# Patient Record
Sex: Female | Born: 1980 | Race: White | Hispanic: No | Marital: Married | Smoking: Former smoker
Health system: Southern US, Community
[De-identification: ages and names within clinical notes are randomized; demographics above are authoritative.]

## PROBLEM LIST (undated history)

## (undated) DIAGNOSIS — R51 Headache: Secondary | ICD-10-CM

## (undated) DIAGNOSIS — F429 Obsessive-compulsive disorder, unspecified: Secondary | ICD-10-CM

## (undated) DIAGNOSIS — K219 Gastro-esophageal reflux disease without esophagitis: Secondary | ICD-10-CM

## (undated) DIAGNOSIS — F419 Anxiety disorder, unspecified: Secondary | ICD-10-CM

## (undated) DIAGNOSIS — F32A Depression, unspecified: Secondary | ICD-10-CM

## (undated) DIAGNOSIS — R519 Headache, unspecified: Secondary | ICD-10-CM

## (undated) DIAGNOSIS — I10 Essential (primary) hypertension: Secondary | ICD-10-CM

## (undated) HISTORY — DX: Anxiety disorder, unspecified: F41.9

## (undated) HISTORY — PX: CYST REMOVAL LEG: SHX6280

## (undated) HISTORY — PX: OTHER SURGICAL HISTORY: SHX169

## (undated) HISTORY — PX: ABDOMINAL HYSTERECTOMY: SHX81

## (undated) HISTORY — DX: Depression, unspecified: F32.A

---

## 2000-07-16 HISTORY — PX: MASTECTOMY, PARTIAL: SHX709

## 2001-02-12 ENCOUNTER — Other Ambulatory Visit: Admission: RE | Admit: 2001-02-12 | Discharge: 2001-02-12 | Payer: Self-pay | Admitting: Obstetrics and Gynecology

## 2001-06-23 ENCOUNTER — Ambulatory Visit (HOSPITAL_COMMUNITY): Admission: AD | Admit: 2001-06-23 | Discharge: 2001-06-23 | Payer: Self-pay | Admitting: Obstetrics and Gynecology

## 2001-07-18 ENCOUNTER — Ambulatory Visit (HOSPITAL_COMMUNITY): Admission: RE | Admit: 2001-07-18 | Discharge: 2001-07-18 | Payer: Self-pay | Admitting: Obstetrics and Gynecology

## 2001-07-31 ENCOUNTER — Ambulatory Visit (HOSPITAL_COMMUNITY): Admission: RE | Admit: 2001-07-31 | Discharge: 2001-07-31 | Payer: Self-pay | Admitting: Obstetrics and Gynecology

## 2001-09-11 ENCOUNTER — Inpatient Hospital Stay (HOSPITAL_COMMUNITY): Admission: AD | Admit: 2001-09-11 | Discharge: 2001-09-14 | Payer: Self-pay | Admitting: Obstetrics and Gynecology

## 2003-05-12 ENCOUNTER — Inpatient Hospital Stay (HOSPITAL_COMMUNITY): Admission: AD | Admit: 2003-05-12 | Discharge: 2003-05-15 | Payer: Self-pay | Admitting: Obstetrics & Gynecology

## 2005-02-28 ENCOUNTER — Ambulatory Visit: Payer: Self-pay | Admitting: Orthopedic Surgery

## 2005-03-02 ENCOUNTER — Ambulatory Visit: Payer: Self-pay | Admitting: Orthopedic Surgery

## 2005-03-02 ENCOUNTER — Ambulatory Visit (HOSPITAL_COMMUNITY): Admission: RE | Admit: 2005-03-02 | Discharge: 2005-03-02 | Payer: Self-pay | Admitting: Orthopedic Surgery

## 2005-03-02 ENCOUNTER — Encounter: Payer: Self-pay | Admitting: Orthopedic Surgery

## 2005-03-05 ENCOUNTER — Ambulatory Visit: Payer: Self-pay | Admitting: Orthopedic Surgery

## 2005-03-12 ENCOUNTER — Ambulatory Visit: Payer: Self-pay | Admitting: Orthopedic Surgery

## 2005-03-28 ENCOUNTER — Ambulatory Visit: Payer: Self-pay | Admitting: Orthopedic Surgery

## 2005-04-12 ENCOUNTER — Ambulatory Visit: Payer: Self-pay | Admitting: Orthopedic Surgery

## 2007-04-14 ENCOUNTER — Encounter (HOSPITAL_COMMUNITY): Admission: RE | Admit: 2007-04-14 | Discharge: 2007-04-15 | Payer: Self-pay | Admitting: Internal Medicine

## 2007-04-15 ENCOUNTER — Emergency Department (HOSPITAL_COMMUNITY): Admission: EM | Admit: 2007-04-15 | Discharge: 2007-04-15 | Payer: Self-pay | Admitting: Emergency Medicine

## 2007-07-17 HISTORY — PX: UMBILICAL HERNIA REPAIR: SHX196

## 2007-07-17 HISTORY — PX: TUBAL LIGATION: SHX77

## 2008-09-24 IMAGING — NM NM HEPATO W/GB/PHARM/[PERSON_NAME]
2 series · 12 of 12 positions shown · non-contrast
Comparison: none

CLINICAL DATA: Nausea and vomiting.
 NUCLEAR MEDICINE HEPATOBILIARY SCAN WITH EJECTION FRACTION:
TECHNIQUE: Sequential abdominal images were obtained following intravenous injection of radiopharmaceutical.  Sequential images were continued following oral ingestion of 8 oz. half-and-half, and the gallbladder ejection fraction was calculated.
 Radiopharmaceutical:  5 mCi Ac-FFm Choletec

[Series 1: hepatobiliary · 3.20mm/px · 6 of 60 frames shown (1 of 2)]
[frame 6/60]
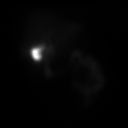
[frame 16/60]
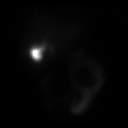
[frame 26/60]
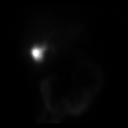
[frame 36/60]
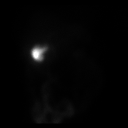
[frame 46/60]
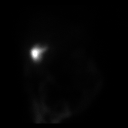
[frame 56/60]
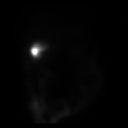

[Series 1: hepatobiliary · 3.20mm/px · 6 of 60 frames shown (2 of 2)]
[frame 6/60]
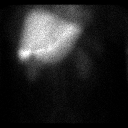
[frame 16/60]
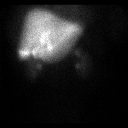
[frame 26/60]
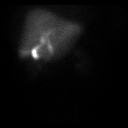
[frame 36/60]
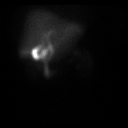
[frame 46/60]
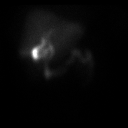
[frame 56/60]
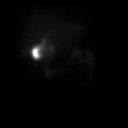

[12 of 12 positions shown; findings below may reference images not displayed]

FINDINGS: Rapid tracer accumulation by the hepatocytes.  Gallbladder activity is appreciated at 20 to 25 minutes.  Biliary ductal activity is noted at 20 minutes.  Small bowel activity is subsequently noted.  The gallbladder ejection fraction is 19.1%.  Normal GB EF is greater than 50% at one hour.
IMPRESSION: Patency of the cystic and biliary ducts.  GB EF is low at 19.1%.

## 2010-12-01 NOTE — Op Note (Signed)
   [name], Madison Berry                          ACCOUNT NO.:  [id number]   MEDICAL RECORD NO.:  [id number]                   PATIENT TYPE:  INP   LOCATION:  LDR1                                 FACILITY:  [facility]   PHYSICIAN:  Tilda Burrow, M.D.              DATE OF BIRTH:  Mar 07, 1981   DATE OF PROCEDURE:  05/13/2003  DATE OF DISCHARGE:                                 OPERATIVE REPORT   PROCEDURE:  Epidural catheter placement.   DESCRIPTION OF PROCEDURE:  Continuous epidural catheter placed using loss of  resistance technique.  We first attempted at L3-4 interspace without success  due to difficulty moving past the bony prominences.  We moved up one  interspace and on the first attempt were easily successful at a depth of  approximately 7 cm from the skin, identifying the epidural space, placing 5  cc test dose of 1.5% Xylocaine with epinephrine followed by insertion of the  epidural catheter 3 cm into the epidural space and then continuous infusion  at 12 cc per hour, with a 12 cc initial bolus administered and analgesic  levels of T10 achieved.  Taping to the back and positioning the patient were  easily accomplished.  The patient rested comfortably.      ___________________________________________                                            Tilda Burrow, M.D.   JVF/MEDQ  D:  05/13/2003  T:  05/13/2003  Job:  [id number]

## 2010-12-01 NOTE — Op Note (Signed)
NAMEYOSHIKO, [name]                ACCOUNT NO.:  [id number]   MEDICAL RECORD NO.:  [id number]          PATIENT TYPE:  AMB   LOCATION:  DAY                           FACILITY:  [facility]   PHYSICIAN:  Vickki Hearing, M.D.DATE OF BIRTH:  12-11-1980   DATE OF PROCEDURE:  03/02/2005  DATE OF DISCHARGE:                                 OPERATIVE REPORT   PRIMARY INDICATIONS:  Pain, right foot with inability to wear shoes.   PREOPERATIVE DIAGNOSIS:  Ganglion cyst, right foot.   POSTOPERATIVE DIAGNOSIS:  Ganglion cyst, right foot.   FINDINGS:  Excision of ganglion cyst, right foot.   ANESTHETIC:  IV sedation and local lidocaine, Sensorcaine mixture.   OPERATIVE FINDINGS:  Large ganglion cyst coming from the tarsometatarsal  joint on the lateral aspect of the foot.   SURGEON:  Vickki Hearing, M.D.   SPECIMENS:  There was one specimen.   BLOOD LOSS:  There was minimal blood loss.   COMPLICATIONS:  There were no complications.   DESCRIPTION OF PROCEDURE:  The procedure was performed as follows.  In the  preoperative area, the patient was identified as Madison Berry.  She  marked her right foot as the surgical site, and was countersigned by the  surgeon.  History and physical was updated and consent for right foot  ganglion excision was confirmed.  After antibiotics were given, she was  .taken to the operating room for IV sedation and local block.   After sterile prep and drape of the foot, the tourniquet was elevated and a  time-out was taken.  Time-out was confirmed, and we made a longitudinal  incision over the lesion.  With blunt dissection we found the lesion in  between the musculature of lateral aspect of the foot on its dorsum, and we  excised the lesion in toto.  It was found to come from the lateral aspect of  the tarsometatarsal joint at the fifth metatarsal bone.  We e. We coagulated  the remaining bed, irrigated the wound, closed the incision with 3-0 nylon  sutures interrupted fashion. We injected a final 6 cc of local block into  the skin.  We released the tourniquet.  We put pressure over the wound,  sterile dressings, and the patient was taken to the recovery room in stable  condition.   She will be in a postoperative  shoe for 2 weeks after which time we will  take out the sutures.  Her followup will be scheduled for Monday for  dressing change.      Vickki Hearing, M.D.  Electronically Signed     SEH/MEDQ  D:  03/02/2005  T:  03/02/2005  Job:  [id number]

## 2010-12-01 NOTE — H&P (Signed)
Madison Berry, Madison Berry                ACCOUNT NO.:  [id number]   MEDICAL RECORD NO.:  [id number]          PATIENT TYPE:  AMB   LOCATION:  DAY                           FACILITY:  [facility]   PHYSICIAN:  Vickki Hearing, M.D.DATE OF BIRTH:  [birth year]   DATE OF ADMISSION:  DATE OF DISCHARGE:  LH                                HISTORY & PHYSICAL   CHIEF COMPLAINT:  Pain in the right foot.   HISTORY:  This is a 30 year old female who presents with severe, constant,  sharp pain in the right foot especially when she is wearing shoes.  Over the  last week or so, she has not been able to put any shoes on and had to wear  flip-flops.  The only thing that relieves it is to take any covering off of  the foot.  She denies any trauma.   She reports weight gain, GERD, joint pain, arthritis, swelling, anxiety.   ALLERGIES:  1.  CODEINE.  2.  SULFA.   PAST MEDICAL HISTORY:  1.  Arthritis.  2.  Acid reflux.  3.  Allergies.   PAST SURGICAL HISTORY:  Lumpectomy from the breast.   MEDICATIONS:  1.  Zyrtec.  2.  Protonix.  3.  Lexapro.   FAMILY HISTORY:  Family history of heart disease, arthritis, cancer, and  diabetes.   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  Has some college credit.   PHYSICAL EXAMINATION:  VITAL SIGNS:  Weight is 230, pulse is 76, respiratory  rate 16.  GENERAL:  Normal development, grooming, hygiene, nutrition.  Body habitus  medium to large.  PSYCHIATRIC:  She is awake, alert, and oriented x3.  Has normal sensation in  the foot.  Has good pulses.  No venous stasis, no temperature changes, no  edema.  SKIN:  A mass over the dorsum of the right foot.  It is firm, mobile, and  tender.  It does not affect the range of motion, strength, stability of her  foot or ankle.  Her upper extremities have normal range of motion, strength,  stability, and alignment.   LABORATORY DATA:  I was able to obtain an x-ray of the foot, it  was normal.   IMPRESSION:  Ganglion of the foot.   PLAN:  Excision of ganglion, right foot.  She has already been given Vicodin  5 mg #60 with two refills.      Vickki Hearing, M.D.  Electronically Signed     SEH/MEDQ  D:  03/01/2005  T:  03/01/2005  Job:  [id number]

## 2010-12-01 NOTE — H&P (Signed)
Madison Berry, Madison Berry                          ACCOUNT NO.:  [id number]   MEDICAL RECORD NO.:  [id number]                   PATIENT TYPE:  INP   LOCATION:  LDR1                                 FACILITY:  [facility]   PHYSICIAN:  Tilda Burrow, M.D.              DATE OF BIRTH:  Jul 11, 1981   DATE OF ADMISSION:  05/12/2003  DATE OF DISCHARGE:                                HISTORY & PHYSICAL   ADMITTING DIAGNOSIS:  1. Pregnancy [redacted] weeks gestation.  2. Elective induction.  3. History of large for gestational age infant.   HISTORY OF PRESENT ILLNESS:  This 30 year old gravida 2, para 1, AB 0, LMP  January 30 placing [facility] at May 22, 2003 by initial menstrual history,  November 15, by first trimester scan; but reaffirmed as November 7 by 19-  week ultrasound.  She is admitted at 39 weeks by consensus criteria.  The  patient has multiple medical and social factors necessitating elective  induction.  The patient's husband is returning home from [place] June 14, 2003, plans are for delivery to fit with his time here which has been  coordinated to fit with her expected due date.  More importantly, the  patient has a history of large for gestational age infant; delivering a 9  pound 1.9 ounce infant after a 3-hour labor with her last child.  We wish to  avoid a large infant and induction is strongly desired by the patient and  agreed upon by Dr. Despina Hidden at last office on May 07, 2003.   The patient is aware of all of the usual risks of labor management including  complications such as infection, bleeding, or need for emergency delivery  can occur with induced labors as soon as spontaneous labors.   PAST MEDICAL HISTORY:  Positive for irritable bowel syndrome.   PAST SURGICAL HISTORY:  Breast biopsy right breast.   ALLERGIES:  SULFA.   SOCIAL HISTORY:  Married, husband in [employer], overseas in [place].   PHYSICAL EXAMINATION:  VITAL SIGNS:  Height 5 feet 8 inches.  Weight 215-  3/4.   Blood pressure 138/70.  HEENT:  Pupils are equal, round, and reactive.  Extraocular movements are  intact.  NECK:  Supple.  CARDIOVASCULAR:  Exam unremarkable.  PELVIC:  Estimated fetal weight 8-1/2 pounds.  Cervix 1-2 cm, soft, 20% with  Foley balloon inserted beneath the head at 7 p.m.Marland Kitchen   Blood type O negative. UDS negative.  Hemoglobin 12, hematocrit 39.  Hepatitis, HIV, RPR, CG and Chlamydia all negative.  Down syndrome 1:4100.  Group B Strep negative.   PLAN:  Balloon dilation overnight. Pitocin induction in the a.m.  Good  prognosis for vaginal delivery.     ___________________________________________  Tilda Burrow, M.D.   JVF/MEDQ  D:  05/12/2003  T:  05/12/2003  Job:  [id number]

## 2010-12-01 NOTE — H&P (Signed)
[facility]  Patient:    Madison Berry, Madison Berry Visit Number: [id number] MRN: [id number]          Service Type: MED Location: 4A A417 01 Attending Physician:  Tilda Burrow Dictated by:   Duane Lope, M.D. Admit Date:  09/11/2001                           History and Physical  HISTORY OF PRESENT ILLNESS:  The patient is a 30 year old white female, gravida 1, para 0.  Estimated date of delivery September 12, 2001.  Currently at [redacted] weeks gestation who was admitted last night with contractions and some bloody show.  On examination, her cervix was 2, 80 and -1, and is probably in early labor, nothing specifically definitive.  She was kept overnight and certainly if she did not labor spontaneously, she would be augmented. However, the patient did seem to keep having good contractions and required pain medicine this morning.  An amniotomy was performed that revealed clear fluid, reactive NST, and contractions about every two to three minutes.  She requested a third dose of pain medicine and an epidural catheter was placed. Dr. Emelda Fear and I placed it without difficulty and she got good pain relief. Still a reactive NST, and currently she is about 5, 100, and -1 station.  PAST MEDICAL HISTORY:  Negative.  PAST SURGICAL HISTORY:  She had a breast lump removed in 2002.  ALLERGIES:  SULFA DRUGS.  She of course is nulliparous.  MEDICATIONS:  Only prenatal vitamins.  SOCIAL HISTORY:  She is single, Theatre manager.  REVIEW OF SYSTEMS:  Otherwise negative.  LABORATORY DATA:  Blood type is O-negative.  Rubella is immune.  The father of the baby is also O-negative, so she did not receive RhoGAM and that was verified with the blood donor card.  GC and Chlamydia is negative.  _______ is negative.  GBS is negative.  H&H is good.  I do not see a serology or rubella on the chart, and that will be followed up.  Her hepatitis B surface antigen is negative and HIV is  negative.  PHYSICAL EXAMINATION:  HEENT:  Unremarkable.  NECK:  Thyroid is normal.  LUNGS:  Clear.  HEART:  Regular rate and rhythm without murmurs, rubs, or gallops.  BREASTS:  Without masses, discharge, or skin changes.  ABDOMEN:  Benign, gravid, and fundal height about 40 cm.  CERVIX:  As above.  EXTREMITIES:  Warm, no edema.  NEUROLOGIC:  Exam is grossly intact.  IMPRESSION: 1. Intrauterine pregnancy at 40 weeks. 2. Early labor.  PLAN:  The patient has progressed pretty well.  She has now had an epidural placed and anticipate an NSVD soon. Dictated by:   Duane Lope, M.D. Attending Physician:  Tilda Burrow DD:  09/12/01 TD:  09/12/01 Job: [id number]

## 2010-12-01 NOTE — Op Note (Signed)
   Madison Berry, Madison Berry                          ACCOUNT NO.:  [id number]   MEDICAL RECORD NO.:  [id number]                   PATIENT TYPE:  INP   LOCATION:  LDR1                                 FACILITY:  [facility]   PHYSICIAN:  Tilda Burrow, M.D.              DATE OF BIRTH:  Jul 27, 1980   DATE OF PROCEDURE:  DATE OF DISCHARGE:                                 OPERATIVE REPORT   LABOR PROGRESS:  Frimet progressed rapidly through labor, and I was called to  attend the delivery at approximately 0930.  After a very brief second stage,  she delivered a viable female infant at 71.  There was a nuchal cord which  was reduced.  The infant had a compound left hand which was posterior.  It  was slipped out first, and the baby delivered easily thereafter.  Apgars  were 9 and 9.  Weight 8 pounds 7.5 ounces.  The placenta separated  spontaneously and delivered via controlled cord traction at 0944.  The  fundus was firm, and minimal blood loss was noted.  Pitocin, 20 units,  diluted in 1000 cc of lactated Ringer's was then infused rapidly IV.  The  epidural catheter was then removed with the blue tip visualized as being  intact.  The perineum and vagina were inspected, and no lacerations were  found.  Of note, the cervix was prolapsed to the introitus at this time.  Attempts were made to push the uterus back up, and a little bit of progress  was made.  The patient was instructed to be aware of this, and Kegel  exercises were prescribed post-delivery.  Estimated blood loss 250 cc.     ________________________________________  ___________________________________________  Jacklyn Shell, C.N.M.           Tilda Burrow, M.D.   FC/MEDQ  D:  05/13/2003  T:  05/13/2003  Job:  [id number]   cc:   [facility]

## 2010-12-01 NOTE — Op Note (Signed)
[facility]  Patient:    Madison Berry, CAISSE Visit Number: [id number] MRN: [id number]          Service Type: MED Location: 4A A417 01 Attending Physician:  Tilda Burrow Dictated by:   Christin Bach, M.D. Admit Date:  09/11/2001                             Operative Report  LABOR SUMMARY AND DELIVERY NOTE:  Ms. Zachery Dauer progressed slowly through labor. She reached 4 to 5 cm and had an epidural catheter placed by Duane Lope, M.D., with my assistance; loss-of-resistance technique was used at L2-3 interspace after unsuccessful efforts at L4-5 interspace.  The patient then proceeded to labor nicely.  She had a single area of continued sensation on the right side but never relieved and required repositioning and manipulation of the epidural catheter.  Unfortunately, it became completely ineffective thereafter.  She reached completely dilated and pushed through a 1-hour and 30-minute second stage with the vertex remaining in a right occipitotransverse position for the first hour.  She then made good progress thereafter, reaching crowning position and delivering over a second degree perineal laceration, delivering a healthy 9-pound ______-ounce female infant, Apgars of 9/9.  Median second degree laceration was repaired under local anesthesia.  Cord blood samples were obtained.  The placenta was interesting in appearance with the umbilical cord measuring greater than 120 cm in length and inserted at a marginal insertion on the side of an otherwise grossly within-normal-limits-appearing placenta.  There was no malodor in the amniotic fluid.  Placenta delivered and was followed by a 500 cc blood loss.  The anterior lip of the cervix was somewhat ecchymotic and very stretched by the delivery process, but there were no lacerations.  After a laceration repair, patient had epidural catheter removed.  It was very superficial and was not in the epidural space by the time  she completed her labor process, but the tip was visualized and confirmed this intact.Dictated by:   Christin Bach, M.D.  Attending Physician:  Tilda Burrow DD:  09/12/01 TD:  09/13/01 Job: [id number]

## 2011-04-26 LAB — DIFFERENTIAL
Lymphocytes Relative: 20
Lymphs Abs: 1.6
Monocytes Absolute: 0.5
Monocytes Relative: 6
Neutro Abs: 5.7

## 2011-04-26 LAB — URINE MICROSCOPIC-ADD ON

## 2011-04-26 LAB — URINALYSIS, ROUTINE W REFLEX MICROSCOPIC
Glucose, UA: NEGATIVE
Hgb urine dipstick: NEGATIVE
Specific Gravity, Urine: 1.013
Urobilinogen, UA: 1

## 2011-04-26 LAB — CBC
HCT: 39
MCV: 82.3
Platelets: 241
RDW: 12.6

## 2011-04-26 LAB — URINE CULTURE

## 2011-04-26 LAB — PREGNANCY, URINE: Preg Test, Ur: POSITIVE

## 2011-04-26 LAB — COMPREHENSIVE METABOLIC PANEL
Albumin: 4
BUN: 3 — ABNORMAL LOW
Creatinine, Ser: 0.45
Potassium: 3.3 — ABNORMAL LOW
Total Protein: 7.5

## 2014-05-27 NOTE — Consult Note (Signed)
Madison Berry, Madison Berry NO.:  [id number]  MEDICAL RECORD NO.:  [id number]  LOCATION:                                 FACILITY:  PHYSICIAN:  Felicie Morn, M.D. DATE OF BIRTH:  1981-04-02  DATE OF CONSULTATION:  05/26/2014 DATE OF DISCHARGE:                                CONSULTATION   NOTE:  Surgery was asked to see this 33 year old white female with a right breast mass.  HISTORY OF PRESENT MEDICAL ILLNESS:  The patient states that she had a palpable mass that got bigger and more uncomfortable over the last 2 or 3 months.  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  She has no family history of breast cancer.  This has continued to cause her problems and be uncomfortable to her and she came to my office where clinically she has rather large mass at least 5 cm in diameter, it feels smooth and movable in her right breast at approximately 10 o'clock position.  She has rather large pendulous breast and is hard to assess completely, but clinically this is what I am palpating.  She also has some bruising in the area where a core biopsy was done at [facility].  PAST MEDICAL HISTORY:  She has a past medical history of reflux, she has esophagitis as well as OCD.  She has a history of hypertension and migraines.  PAST SURGERY:  Has included a right partial mastectomy for benign disease in 2002, a tubal ligation and umbilical hernia repair in 2009. She had a vaginal hysterectomy and bladder tack surgery in 2011, and she also had a cyst removed from her right foot.  MEDICATIONS:  Please see medication list.  ALLERGIES:  She is allergic to [place] and IMITREX.  SOCIAL HISTORY:  She is a nonsmoker, nondrinker.  FAMILY HISTORY:  The father has a history of colon cancer and prostate cancer.  PHYSICAL  EXAMINATION:  GENERAL:  She is in no acute distress. VITAL SIGNS:  She is 5 feet 9 inches and weighs 245 pounds, temperature is 97.6, pulse is 80 and regular, respirations 14, blood pressure 130/76. HEENT:  Head is normocephalic.  Eyes, extraocular movements are intact. Pupils are round and reactive to light and accommodation.  No bruits are appreciated.  No adenopathy.  No thyromegaly. CHEST:  Clear, both the anterior and posterior auscultation. HEART:  Regular rhythm. BREASTS:  Pendulous.  There is a smooth mass that is movable at the 10 o'clock position on the right breast.  There is no history of nipple discharge.  No axillary adenopathy appreciated bilaterally. ABDOMEN:  Soft.  There is no visceromegaly.  There are no hernias appreciated. RECTAL AND PELVIC:  Deferred. EXTREMITIES:  Grossly within normal limits.  REVIEW OF SYSTEMS:  NEUROLOGIC SYSTEM:  No history of migraines and a history of OCD.  No history of seizures and she has no lateralizing neurological findings.  ENDOCRINE SYSTEM:  No history of diabetes, thyroid disease, or adrenal  problems.  CARDIOPULMONARY SYSTEM:  History of hypertension.  MUSCULOSKELETAL SYSTEM:  Obesity.  OB/GYN HISTORY: She is a gravida 3, para 3, cesarean 0, abortus 0 female with no family history of carcinoma of the breast.  She had a tubal ligation and a vaginal hysterectomy in the past and her last menstrual period was 2011, at the time of her vaginal hysterectomy.  GI HISTORY:  History of diverticulosis, history of reflux.  She had a colonoscopy in 2014.  She has no past history of hepatitis.  No problems of constipation, diarrhea, or recent bright red rectal bleeding.  No history of melena, no history of inflammatory bowel disease or irritable bowel syndrome and no history of unexplained weight loss.  GU SYSTEM:  No history of frequency, dysuria, or kidney stones.  REVIEW OF HISTORY AND PHYSICAL:  Therefore, Madison Berry is a  33 year old white female who has a large uncomfortable mass in her right breast.  We will perform an excisional biopsy of this.  We discussed complications, not limited to, but including bleeding and infection and the possibility of further surgery might be required.  An informed consent was obtained. We will take care of her after which we will return her to Dr. Sherrie Sport, to continue her medical care.     Felicie Morn, M.D.     WB/MEDQ  D:  05/26/2014  T:  05/27/2014  Job:  [id number]  cc:   Stoney Bang, MD Fax: [phone]

## 2014-05-28 ENCOUNTER — Encounter (HOSPITAL_COMMUNITY)
Admission: RE | Admit: 2014-05-28 | Discharge: 2014-05-28 | Disposition: A | Discharge status: Home / Self Care | Payer: No Typology Code available for payment source | Source: Ambulatory Visit | Attending: General Surgery | Admitting: General Surgery

## 2014-05-28 ENCOUNTER — Other Ambulatory Visit: Payer: Self-pay

## 2014-05-28 ENCOUNTER — Encounter (HOSPITAL_COMMUNITY): Payer: Self-pay

## 2014-05-28 DIAGNOSIS — Z01818 Encounter for other preprocedural examination: Secondary | ICD-10-CM | POA: Insufficient documentation

## 2014-05-28 HISTORY — DX: Obsessive-compulsive disorder, unspecified: F42.9

## 2014-05-28 HISTORY — DX: Gastro-esophageal reflux disease without esophagitis: K21.9

## 2014-05-28 HISTORY — DX: Essential (primary) hypertension: I10

## 2014-05-28 HISTORY — DX: Headache: R51

## 2014-05-28 HISTORY — DX: Headache, unspecified: R51.9

## 2014-05-28 LAB — CBC WITH DIFFERENTIAL/PLATELET
BASOS ABS: 0 10*3/uL (ref 0.0–0.1)
BASOS PCT: 0 % (ref 0–1)
EOS ABS: 0.2 10*3/uL (ref 0.0–0.7)
Eosinophils Relative: 2 % (ref 0–5)
HEMATOCRIT: 40.1 % (ref 36.0–46.0)
HEMOGLOBIN: 13.9 g/dL (ref 12.0–15.0)
Lymphocytes Relative: 25 % (ref 12–46)
Lymphs Abs: 2 10*3/uL (ref 0.7–4.0)
MCH: 28.3 pg (ref 26.0–34.0)
MCHC: 34.7 g/dL (ref 30.0–36.0)
MCV: 81.7 fL (ref 78.0–100.0)
MONO ABS: 0.4 10*3/uL (ref 0.1–1.0)
MONOS PCT: 5 % (ref 3–12)
NEUTROS PCT: 68 % (ref 43–77)
Neutro Abs: 5.7 10*3/uL (ref 1.7–7.7)
Platelets: 248 10*3/uL (ref 150–400)
RBC: 4.91 MIL/uL (ref 3.87–5.11)
RDW: 12.7 % (ref 11.5–15.5)
WBC: 8.3 10*3/uL (ref 4.0–10.5)

## 2014-05-28 LAB — BASIC METABOLIC PANEL
ANION GAP: 14 (ref 5–15)
BUN: 10 mg/dL (ref 6–23)
CO2: 24 mEq/L (ref 19–32)
CREATININE: 0.69 mg/dL (ref 0.50–1.10)
Calcium: 9.2 mg/dL (ref 8.4–10.5)
Chloride: 99 mEq/L (ref 96–112)
Glucose, Bld: 118 mg/dL — ABNORMAL HIGH (ref 70–99)
Potassium: 3.7 mEq/L (ref 3.7–5.3)
Sodium: 137 mEq/L (ref 137–147)

## 2014-05-28 NOTE — Patient Instructions (Signed)
Julena L Gilpatrick  05/28/2014   Your procedure is scheduled on:   06/02/2014  Report to [facility] at  77  AM.  Call this number if you have problems the morning of surgery: [phone]   Remember:   Do not eat food or drink liquids after midnight.   Take these medicines the morning of surgery with A SIP OF WATER:  Prilosec, fluoxetine, effexor, benazepril   Do not wear jewelry, make-up or nail polish.  Do not wear lotions, powders, or perfumes.   Do not shave 48 hours prior to surgery. Men may shave face and neck.  Do not bring valuables to the hospital.  [facility] is not responsible for any belongings or valuables.               Contacts, dentures or bridgework may not be worn into surgery.  Leave suitcase in the car. After surgery it may be brought to your room.  For patients admitted to the hospital, discharge time is determined by your treatment team.               Patients discharged the day of surgery will not be allowed to drive home.  Name and phone number of your driver: family  Special Instructions: Shower using CHG 2 nights before surgery and the night before surgery.  If you shower the day of surgery use CHG.  Use special wash - you have one bottle of CHG for all showers.  You should use approximately 1/3 of the bottle for each shower.   Please read over the following fact sheets that you were given: Pain Booklet, Coughing and Deep Breathing, Surgical Site Infection Prevention, Anesthesia Post-op Instructions and Care and Recovery After Surgery Breast Biopsy A breast biopsy is a test during which a sample of tissue is taken from your breast. The breast tissue is looked at under a microscope for cancer cells.  BEFORE THE PROCEDURE  Make plans to have someone drive you home after the test.  Do not smoke for 2 weeks before the test. Stop smoking, if you smoke.  Do not drink alcohol for 24 hours before the test.  Wear a good support bra to the test. PROCEDURE    You may be given one of the following:  A medicine to numb the breast area (local anesthetic).  A medicine to make you fall asleep (general anesthetic). There are different types of breast biopsies. They include:  Fine-needle aspiration.  A needle is put into the breast lump.  The needle takes out fluid and cells from the lump.  Ultrasound imaging may be used to help find the lump and to put the needle in the right spot.  Core-needle biopsy.  A needle is put into the breast lump.  The needle is put in your breast 3-6 times.  The needle removes breast tissue.  An ultrasound image or X-ray is often used to find the right spot to put in the needle.  Stereotactic biopsy.  X-rays and a computer are used to study X-ray pictures of the breast lump.  The computer finds where the needle needs to be put into the breast.  Tissue samples are taken out.  Vacuum-assisted biopsy.  A small cut (incision) is made in your breast.  A biopsy device is put through the cut and into the breast tissue.  The biopsy device draws abnormal breast tissue into the biopsy device.  A large tissue sample is often removed.  No stitches are needed.  Ultrasound-guided core-needle biopsy.  Ultrasound imaging helps guide the needle into the area of the breast that is not normal.  A cut is made in the breast. The needle is put into the breast lump.  Tissue samples are taken out.  Open biopsy.  A large cut is made in the breast.  Your doctor will try to remove the whole breast lump or as much as possible. All tissue, fluid, or cell samples are looked at under a microscope.  AFTER THE PROCEDURE  You will be taken to an area to recover. You will be able to go home once you are doing well and are without problems.  You may have bruising on your breast. This is normal.  A pressure bandage (dressing) may be put on your breast for 24-48 hours. This type of bandage is wrapped tightly around your  chest. It helps stop fluid from building up underneath tissues. Document Released: 09/24/2011 Document Revised: 11/16/2013 Document Reviewed: 09/24/2011 [facility] Patient Information 2015 [place], [place]. This information is not intended to replace advice given to you by your health care provider. Make sure you discuss any questions you have with your health care provider. PATIENT INSTRUCTIONS POST-ANESTHESIA  IMMEDIATELY FOLLOWING SURGERY:  Do not drive or operate machinery for the first twenty four hours after surgery.  Do not make any important decisions for twenty four hours after surgery or while taking narcotic pain medications or sedatives.  If you develop intractable nausea and vomiting or a severe headache please notify your doctor immediately.  FOLLOW-UP:  Please make an appointment with your surgeon as instructed. You do not need to follow up with anesthesia unless specifically instructed to do so.  WOUND CARE INSTRUCTIONS (if applicable):  Keep a dry clean dressing on the anesthesia/puncture wound site if there is drainage.  Once the wound has quit draining you may leave it open to air.  Generally you should leave the bandage intact for twenty four hours unless there is drainage.  If the epidural site drains for more than 36-48 hours please call the anesthesia department.  QUESTIONS?:  Please feel free to call your physician or the hospital operator if you have any questions, and they will be happy to assist you.

## 2014-05-28 NOTE — Pre-Procedure Instructions (Signed)
Patient given information to sign up for my chart at home. 

## 2014-06-02 ENCOUNTER — Ambulatory Visit (HOSPITAL_COMMUNITY): Payer: No Typology Code available for payment source | Admitting: Anesthesiology

## 2014-06-02 ENCOUNTER — Ambulatory Visit (HOSPITAL_COMMUNITY)
Admission: RE | Admit: 2014-06-02 | Discharge: 2014-06-02 | Disposition: A | Discharge status: Home / Self Care | Payer: No Typology Code available for payment source | Source: Ambulatory Visit | Attending: General Surgery | Admitting: General Surgery

## 2014-06-02 ENCOUNTER — Encounter (HOSPITAL_COMMUNITY)
Admission: RE | Disposition: A | Discharge status: Home / Self Care | Payer: Self-pay | Source: Ambulatory Visit | Attending: General Surgery

## 2014-06-02 ENCOUNTER — Encounter (HOSPITAL_COMMUNITY): Payer: Self-pay | Admitting: *Deleted

## 2014-06-02 DIAGNOSIS — I10 Essential (primary) hypertension: Secondary | ICD-10-CM | POA: Diagnosis not present

## 2014-06-02 DIAGNOSIS — K219 Gastro-esophageal reflux disease without esophagitis: Secondary | ICD-10-CM | POA: Insufficient documentation

## 2014-06-02 DIAGNOSIS — N63 Unspecified lump in breast: Secondary | ICD-10-CM | POA: Diagnosis present

## 2014-06-02 DIAGNOSIS — D241 Benign neoplasm of right breast: Secondary | ICD-10-CM | POA: Diagnosis not present

## 2014-06-02 DIAGNOSIS — Z87891 Personal history of nicotine dependence: Secondary | ICD-10-CM | POA: Insufficient documentation

## 2014-06-02 HISTORY — PX: MASTECTOMY, PARTIAL: SHX709

## 2014-06-02 SURGERY — MASTECTOMY PARTIAL
Anesthesia: General | Site: Breast | Laterality: Right

## 2014-06-02 MED ORDER — EPHEDRINE SULFATE 50 MG/ML IJ SOLN
INTRAMUSCULAR | Status: AC
  Filled 2014-06-02: qty 1

## 2014-06-02 MED ORDER — 0.9 % SODIUM CHLORIDE (POUR BTL) OPTIME
TOPICAL | Status: DC | PRN
  Administered 2014-06-02: 1000 mL

## 2014-06-02 MED ORDER — MIDAZOLAM HCL 2 MG/2ML IJ SOLN
INTRAMUSCULAR | Status: AC
  Filled 2014-06-02: qty 2

## 2014-06-02 MED ORDER — OXYCODONE-ACETAMINOPHEN 10-325 MG PO TABS: 1.0000 | ORAL_TABLET | ORAL | Status: DC | PRN

## 2014-06-02 MED ORDER — SODIUM CHLORIDE 0.9 % IJ SOLN
INTRAMUSCULAR | Status: AC
  Filled 2014-06-02: qty 10

## 2014-06-02 MED ORDER — ROCURONIUM BROMIDE 50 MG/5ML IV SOLN
INTRAVENOUS | Status: AC
  Filled 2014-06-02: qty 1

## 2014-06-02 MED ORDER — FENTANYL CITRATE 0.05 MG/ML IJ SOLN
INTRAMUSCULAR | Status: AC
  Filled 2014-06-02: qty 5

## 2014-06-02 MED ORDER — ONDANSETRON HCL 4 MG/2ML IJ SOLN: 4.0000 mg | Freq: Once | INTRAMUSCULAR | Status: DC | PRN

## 2014-06-02 MED ORDER — PROPOFOL 10 MG/ML IV BOLUS
INTRAVENOUS | Status: AC
  Filled 2014-06-02: qty 20

## 2014-06-02 MED ORDER — LACTATED RINGERS IV SOLN
INTRAVENOUS | Status: DC
  Administered 2014-06-02: 1000 mL via INTRAVENOUS

## 2014-06-02 MED ORDER — BACITRACIN-NEOMYCIN-POLYMYXIN OINTMENT TUBE
TOPICAL_OINTMENT | CUTANEOUS | Status: DC | PRN
  Administered 2014-06-02: 1 via TOPICAL

## 2014-06-02 MED ORDER — STERILE WATER FOR IRRIGATION IR SOLN
Status: DC | PRN
  Administered 2014-06-02: 2000 mL

## 2014-06-02 MED ORDER — BUPIVACAINE HCL (PF) 0.5 % IJ SOLN
INTRAMUSCULAR | Status: DC | PRN
  Administered 2014-06-02: 9 mL

## 2014-06-02 MED ORDER — BUPIVACAINE HCL (PF) 0.5 % IJ SOLN
INTRAMUSCULAR | Status: AC
  Filled 2014-06-02: qty 30

## 2014-06-02 MED ORDER — NEOSTIGMINE METHYLSULFATE 10 MG/10ML IV SOLN
INTRAVENOUS | Status: DC | PRN
  Administered 2014-06-02: 4 mg via INTRAVENOUS

## 2014-06-02 MED ORDER — SUCCINYLCHOLINE CHLORIDE 20 MG/ML IJ SOLN
INTRAMUSCULAR | Status: AC
  Filled 2014-06-02: qty 1

## 2014-06-02 MED ORDER — FENTANYL CITRATE 0.05 MG/ML IJ SOLN
INTRAMUSCULAR | Status: DC | PRN
  Administered 2014-06-02: 25 ug via INTRAVENOUS
  Administered 2014-06-02 (×6): 50 ug via INTRAVENOUS
  Administered 2014-06-02: 25 ug via INTRAVENOUS

## 2014-06-02 MED ORDER — FENTANYL CITRATE 0.05 MG/ML IJ SOLN
INTRAMUSCULAR | Status: AC
  Filled 2014-06-02: qty 2

## 2014-06-02 MED ORDER — CEFAZOLIN SODIUM-DEXTROSE 2-3 GM-% IV SOLR: 2.0000 g | Freq: Once | INTRAVENOUS | Status: DC

## 2014-06-02 MED ORDER — GLYCOPYRROLATE 0.2 MG/ML IJ SOLN
INTRAMUSCULAR | Status: AC
  Filled 2014-06-02: qty 3

## 2014-06-02 MED ORDER — NEOSTIGMINE METHYLSULFATE 10 MG/10ML IV SOLN
INTRAVENOUS | Status: AC
  Filled 2014-06-02: qty 1

## 2014-06-02 MED ORDER — PROPOFOL 10 MG/ML IV BOLUS
INTRAVENOUS | Status: DC | PRN
  Administered 2014-06-02: 150 mg via INTRAVENOUS

## 2014-06-02 MED ORDER — CEFAZOLIN SODIUM 1-5 GM-% IV SOLN
INTRAVENOUS | Status: AC
  Filled 2014-06-02: qty 50

## 2014-06-02 MED ORDER — LACTATED RINGERS IV SOLN
INTRAVENOUS | Status: DC | PRN
  Administered 2014-06-02: 1000 mL
  Administered 2014-06-02 (×2): via INTRAVENOUS

## 2014-06-02 MED ORDER — CEFAZOLIN SODIUM-DEXTROSE 2-3 GM-% IV SOLR
INTRAVENOUS | Status: AC
Start: 2014-06-02 — End: 2014-06-02
  Filled 2014-06-02: qty 50

## 2014-06-02 MED ORDER — CEFAZOLIN SODIUM-DEXTROSE 2-3 GM-% IV SOLR
2.0000 g | Freq: Once | INTRAVENOUS | Status: AC
  Administered 2014-06-02: 2 g via INTRAVENOUS

## 2014-06-02 MED ORDER — GLYCOPYRROLATE 0.2 MG/ML IJ SOLN
INTRAMUSCULAR | Status: DC | PRN
  Administered 2014-06-02: 0.6 mg via INTRAVENOUS

## 2014-06-02 MED ORDER — ONDANSETRON HCL 4 MG/2ML IJ SOLN
4.0000 mg | Freq: Once | INTRAMUSCULAR | Status: AC
  Administered 2014-06-02: 4 mg via INTRAVENOUS

## 2014-06-02 MED ORDER — LIDOCAINE HCL (CARDIAC) 10 MG/ML IV SOLN
INTRAVENOUS | Status: DC | PRN
  Administered 2014-06-02: 50 mg via INTRAVENOUS

## 2014-06-02 MED ORDER — ROCURONIUM BROMIDE 100 MG/10ML IV SOLN
INTRAVENOUS | Status: DC | PRN
  Administered 2014-06-02: 10 mg via INTRAVENOUS
  Administered 2014-06-02: 5 mg via INTRAVENOUS

## 2014-06-02 MED ORDER — FENTANYL CITRATE 0.05 MG/ML IJ SOLN
25.0000 ug | INTRAMUSCULAR | Status: DC | PRN
  Administered 2014-06-02 (×2): 50 ug via INTRAVENOUS

## 2014-06-02 MED ORDER — BACITRACIN-NEOMYCIN-POLYMYXIN 400-5-5000 EX OINT
TOPICAL_OINTMENT | CUTANEOUS | Status: AC
  Filled 2014-06-02: qty 1

## 2014-06-02 MED ORDER — ONDANSETRON HCL 4 MG/2ML IJ SOLN
INTRAMUSCULAR | Status: AC
  Filled 2014-06-02: qty 2

## 2014-06-02 MED ORDER — LIDOCAINE HCL (PF) 1 % IJ SOLN
INTRAMUSCULAR | Status: AC
  Filled 2014-06-02: qty 5

## 2014-06-02 MED ORDER — SUCCINYLCHOLINE CHLORIDE 20 MG/ML IJ SOLN
INTRAMUSCULAR | Status: DC | PRN
  Administered 2014-06-02: 120 mg via INTRAVENOUS

## 2014-06-02 MED ORDER — DEXTROSE 5 % IV SOLN: 3.0000 g | Freq: Once | INTRAVENOUS | Status: DC

## 2014-06-02 MED ORDER — MIDAZOLAM HCL 2 MG/2ML IJ SOLN
1.0000 mg | INTRAMUSCULAR | Status: DC | PRN
  Administered 2014-06-02: 2 mg via INTRAVENOUS

## 2014-06-02 SURGICAL SUPPLY — 42 items
ATTRACTOMAT 16X20 MAGNETIC DRP (DRAPES) ×3 IMPLANT
BAG HAMPER (MISCELLANEOUS) ×3 IMPLANT
BLADE 15 SAFETY STRL DISP (BLADE) ×7 IMPLANT
CLOSURE WOUND 1/2 X4 (GAUZE/BANDAGES/DRESSINGS) ×1
CLOTH BEACON ORANGE TIMEOUT ST (SAFETY) ×3 IMPLANT
COVER LIGHT HANDLE STERIS (MISCELLANEOUS) ×6 IMPLANT
DECANTER SPIKE VIAL GLASS SM (MISCELLANEOUS) ×3 IMPLANT
DRAPE PROXIMA HALF (DRAPES) ×3 IMPLANT
ELECT REM PT RETURN 9FT ADLT (ELECTROSURGICAL) ×3
ELECTRODE REM PT RTRN 9FT ADLT (ELECTROSURGICAL) ×1 IMPLANT
GAUZE SPONGE 4X4 12PLY STRL (GAUZE/BANDAGES/DRESSINGS) ×2 IMPLANT
GLOVE ECLIPSE 6.5 STRL STRAW (GLOVE) ×4 IMPLANT
GLOVE INDICATOR 7.0 STRL GRN (GLOVE) ×4 IMPLANT
GLOVE SKINSENSE NS SZ7.0 (GLOVE) ×2
GLOVE SKINSENSE STRL SZ7.0 (GLOVE) ×1 IMPLANT
GOWN STRL REUS W/TWL LRG LVL3 (GOWN DISPOSABLE) ×6 IMPLANT
INST SET MINOR GENERAL (KITS) ×3 IMPLANT
KIT ROOM TURNOVER APOR (KITS) ×3 IMPLANT
MANIFOLD NEPTUNE II (INSTRUMENTS) ×3 IMPLANT
MARKER SKIN DUAL TIP RULER LAB (MISCELLANEOUS) ×3 IMPLANT
NDL HYPO 18GX1.5 BLUNT FILL (NEEDLE) IMPLANT
NDL HYPO 25X1 1.5 SAFETY (NEEDLE) IMPLANT
NEEDLE HYPO 18GX1.5 BLUNT FILL (NEEDLE) IMPLANT
NEEDLE HYPO 25X1 1.5 SAFETY (NEEDLE) IMPLANT
PACK MINOR (CUSTOM PROCEDURE TRAY) ×3 IMPLANT
PAD ARMBOARD 7.5X6 YLW CONV (MISCELLANEOUS) ×3 IMPLANT
SET BASIN LINEN APH (SET/KITS/TRAYS/PACK) ×3 IMPLANT
SOL PREP PROV IODINE SCRUB 4OZ (MISCELLANEOUS) ×3 IMPLANT
SPONGE GAUZE 4X4 12PLY (GAUZE/BANDAGES/DRESSINGS) ×2 IMPLANT
STRIP CLOSURE SKIN 1/2X4 (GAUZE/BANDAGES/DRESSINGS) ×1 IMPLANT
SUT VIC AB 3-0 SH 27 (SUTURE) ×3
SUT VIC AB 3-0 SH 27X BRD (SUTURE) ×1 IMPLANT
SUT VIC AB 4-0 PS2 27 (SUTURE) ×2 IMPLANT
SUT VIC AB 5-0 P-3 18X BRD (SUTURE) ×1 IMPLANT
SUT VIC AB 5-0 P3 18 (SUTURE) ×3
SUT VICRYL AB 3 0 TIES (SUTURE) ×3 IMPLANT
SUT VICRYL AB 3-0 BRD CT 36IN (SUTURE) ×4 IMPLANT
SYR BULB IRRIGATION 50ML (SYRINGE) ×3 IMPLANT
SYR CONTROL 10ML LL (SYRINGE) ×3 IMPLANT
TAPE CLOTH SURG 4X10 WHT LF (GAUZE/BANDAGES/DRESSINGS) ×2 IMPLANT
TOWEL OR 17X26 4PK STRL BLUE (TOWEL DISPOSABLE) ×3 IMPLANT
WATER STERILE IRR 1000ML POUR (IV SOLUTION) ×6 IMPLANT

## 2014-06-02 NOTE — Op Note (Signed)
NAMESHAR, Madison Berry NO.:  [id number]  MEDICAL RECORD NO.:  [id number]  LOCATION:  [facility]                          FACILITY:  [facility]  PHYSICIAN:  Felicie Morn, M.D. DATE OF BIRTH:  12/08/1980  DATE OF PROCEDURE:  06/02/2014 DATE OF DISCHARGE:                              OPERATIVE REPORT   SURGEON:  Felicie Morn, MD  PREOPERATIVE DIAGNOSIS:  Right breast mass.  POSTOPERATIVE DIAGNOSIS:  Right breast mass.  NOTE:  This is a 33 year old white female who had a 17-month history of an enlarging uncomfortable mass in the lateral aspect of her right breast.  A sonogram was done as she was worked up in [place] and was found to have a solid mass in this area.  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  This was a large mass at least 3 or 4 cm in diameter by palpation, and a needle biopsy was done in [place] which showed likely a fibroadenoma.  We discussed the surgery with her in detail discussing complications, not limited to, but including bleeding, infection, and the possibility of further surgery might be required. Informed consent was obtained.  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  This did not appear to be a Phyllodes tumor as thought possible on the sonogram.  The final pathology is pending.  The specimen was sent in formalin for ER and PR receptors.  TECHNIQUE:  The patient was placed in a supine position slightly rotated to the left side.  A longitudinal incision was carried out over the palpated mass.  Skin of subcutaneous tissue and breast tissue was excised.  We palpated the mass and removed this.  This basically was able to be easily removed from the surrounding breast tissue without any problem.  There was minimal blood loss less than 10 mL.  The breast tissue was approximated with 3-0 Polysorb after irrigating with normal saline solution.  We used about 8 mL of 0.5%  Marcaine for postoperative comfort.  We closed the wound with a subcuticular 4-0 Vicryl suture. Half-inch Steri-Strips were applied, and a sterile dressing was applied as well.  No drains were placed.  Prior to closure, all sponge, needle, and instrument counts were found to be correct.  The patient received a liter of fluids, and as stated, lost less than 10 mL of blood.  The patient was taken to recovery room in satisfactory condition.     Felicie Morn, M.D.     WB/MEDQ  D:  06/02/2014  T:  06/02/2014  Job:  [id number]  cc:   Stoney Bang, MD Fax: [phone]

## 2014-06-02 NOTE — Anesthesia Procedure Notes (Signed)
Procedure Name: Intubation Date/Time: 06/02/2014 7:44 AM Performed by: Andree Elk, Joelys Staubs A Pre-anesthesia Checklist: Patient identified, Patient being monitored, Timeout performed, Emergency Drugs available and Suction available Patient Re-evaluated:Patient Re-evaluated prior to inductionOxygen Delivery Method: Circle System Utilized Preoxygenation: Pre-oxygenation with 100% oxygen Intubation Type: IV induction Ventilation: Mask ventilation without difficulty Laryngoscope Size: 3 and Miller Grade View: Grade I Tube type: Oral Tube size: 7.0 mm Number of attempts: 1 Airway Equipment and Method: stylet Placement Confirmation: ETT inserted through vocal cords under direct vision,  positive ETCO2 and breath sounds checked- equal and bilateral Secured at: 21 cm Tube secured with: Tape Dental Injury: Teeth and Oropharynx as per pre-operative assessment

## 2014-06-02 NOTE — Brief Op Note (Signed)
06/02/2014  8:55 AM  PATIENT:  Jeri L Goldsmith  33 y.o. female  PRE-OPERATIVE DIAGNOSIS:  right breast mass  POST-OPERATIVE DIAGNOSIS:  right breast mass  PROCEDURE:  Procedure(s): MASTECTOMY PARTIAL (Right)  SURGEON:  Surgeon(s) and Role:    * Scherry Ran, MD - Primary  PHYSICIAN ASSISTANT:   ASSISTANTS: none   ANESTHESIA:   general  EBL:  Total I/O In: 600 [I.V.:600] Out: 25 [Blood:25]  BLOOD ADMINISTERED:none  DRAINS: none   LOCAL MEDICATIONS USED:  MARCAINE  0.5%  ~ 8 cc  SPECIMEN:  Source of Specimen:  Right breast mass.  DISPOSITION OF SPECIMEN:  PATHOLOGY  COUNTS:  YES  TOURNIQUET:  * No tourniquets in log *  DICTATION: .Other Dictation: Dictation Number OR dict. #[id number].   PLAN OF CARE: Discharge to home after PACU  PATIENT DISPOSITION:  PACU - hemodynamically stable.   Delay start of Pharmacological VTE agent (>24hrs) due to surgical blood loss or risk of bleeding: not applicable

## 2014-06-02 NOTE — Anesthesia Postprocedure Evaluation (Signed)
  Anesthesia Post-op Note  Patient: Madison Berry  Procedure(s) Performed: Procedure(s): MASTECTOMY PARTIAL (Right)  Patient Location: PACU  Anesthesia Type:General  Level of Consciousness: awake, alert , oriented and patient cooperative  Airway and Oxygen Therapy: Patient Spontanous Breathing  Post-op Pain: mild Late Entry Post-op Assessment: Post-op Vital signs reviewed, Patient's Cardiovascular Status Stable, Respiratory Function Stable, Patent Airway and No signs of Nausea or vomiting  Post-op Vital Signs: Reviewed and stable  Last Vitals:  Filed Vitals:   06/02/14 0954  BP: 115/79  Pulse: 73  Temp: 36.5 C  Resp: 18    Complications: No apparent anesthesia complications

## 2014-06-02 NOTE — Progress Notes (Signed)
Filed Vitals:   06/02/14 0720  BP: 157/94  Pulse:   Temp:   Resp: 24  pulse 71, temp 98.3  33 yr. Old W. Female for right partial mastectomy for mass right breast.  Sonogram showed solid mass and needle bx revealed likely fibroadenoma.  This has increased  In size and discomfort.  Pt is admitted via OP dept for excisional bx.  Procedure and risks discussed and informed consent obtained.   Surgical site marked and pre op labs reviewed.  No significant changes clinically since H&P.   Dict # [id number].

## 2014-06-02 NOTE — Transfer of Care (Signed)
Immediate Anesthesia Transfer of Care Note  Patient: Madison Berry  Procedure(s) Performed: Procedure(s): MASTECTOMY PARTIAL (Right)  Patient Location: PACU  Anesthesia Type:General  Level of Consciousness: awake, alert , oriented and patient cooperative  Airway & Oxygen Therapy: Patient Spontanous Breathing and Patient connected to face mask oxygen  Post-op Assessment: Report given to PACU RN and Post -op Vital signs reviewed and stable  Post vital signs: Reviewed and stable  Complications: No apparent anesthesia complications

## 2014-06-02 NOTE — Anesthesia Preprocedure Evaluation (Signed)
Anesthesia Evaluation  Patient identified by MRN, date of birth, ID band Patient awake    Reviewed: Allergy & Precautions, H&P , NPO status , Patient's Chart, lab work & pertinent test results  Airway Mallampati: II  TM Distance: >3 FB Neck ROM: Full    Dental  (+) Teeth Intact   Pulmonary former smoker,  breath sounds clear to auscultation        Cardiovascular hypertension, Pt. on medications Rhythm:Regular Rate:Normal     Neuro/Psych  Headaches, PSYCHIATRIC DISORDERS (OCD)    GI/Hepatic GERD-  Medicated and Poorly Controlled,  Endo/Other  Morbid obesity  Renal/GU      Musculoskeletal   Abdominal   Peds  Hematology   Anesthesia Other Findings   Reproductive/Obstetrics                             Anesthesia Physical Anesthesia Plan  ASA: III  Anesthesia Plan: General   Post-op Pain Management:    Induction: Intravenous, Rapid sequence and Cricoid pressure planned  Airway Management Planned: Oral ETT  Additional Equipment:   Intra-op Plan:   Post-operative Plan: Extubation in OR  Informed Consent: I have reviewed the patients History and Physical, chart, labs and discussed the procedure including the risks, benefits and alternatives for the proposed anesthesia with the patient or authorized representative who has indicated his/her understanding and acceptance.     Plan Discussed with:   Anesthesia Plan Comments:         Anesthesia Quick Evaluation

## 2014-06-03 ENCOUNTER — Encounter (HOSPITAL_COMMUNITY): Payer: Self-pay | Admitting: General Surgery

## 2016-06-13 ENCOUNTER — Encounter: Payer: Self-pay | Admitting: Family Medicine

## 2016-06-13 ENCOUNTER — Ambulatory Visit (INDEPENDENT_AMBULATORY_CARE_PROVIDER_SITE_OTHER): Payer: No Typology Code available for payment source | Admitting: Family Medicine

## 2016-06-13 VITALS — BP 135/80 | HR 88 | Temp 97.1°F | Ht 69.0 in | Wt 274.1 lb

## 2016-06-13 DIAGNOSIS — R5383 Other fatigue: Secondary | ICD-10-CM | POA: Diagnosis not present

## 2016-06-13 DIAGNOSIS — F418 Other specified anxiety disorders: Secondary | ICD-10-CM | POA: Diagnosis not present

## 2016-06-13 DIAGNOSIS — K219 Gastro-esophageal reflux disease without esophagitis: Secondary | ICD-10-CM

## 2016-06-13 DIAGNOSIS — Z13220 Encounter for screening for lipoid disorders: Secondary | ICD-10-CM

## 2016-06-13 DIAGNOSIS — R21 Rash and other nonspecific skin eruption: Secondary | ICD-10-CM | POA: Diagnosis not present

## 2016-06-13 DIAGNOSIS — I10 Essential (primary) hypertension: Secondary | ICD-10-CM

## 2016-06-13 DIAGNOSIS — M2550 Pain in unspecified joint: Secondary | ICD-10-CM | POA: Diagnosis not present

## 2016-06-13 DIAGNOSIS — F419 Anxiety disorder, unspecified: Secondary | ICD-10-CM

## 2016-06-13 DIAGNOSIS — F329 Major depressive disorder, single episode, unspecified: Secondary | ICD-10-CM

## 2016-06-13 DIAGNOSIS — F32A Depression, unspecified: Secondary | ICD-10-CM

## 2016-06-13 MED ORDER — PREDNISONE 20 MG PO TABS: ORAL_TABLET | ORAL | 0 refills | Status: DC

## 2016-06-13 MED ORDER — FLUOXETINE HCL 40 MG PO CAPS: 40.0000 mg | ORAL_CAPSULE | Freq: Every day | ORAL | 2 refills | Status: DC

## 2016-06-13 NOTE — Progress Notes (Signed)
BP 135/80   Pulse 88   Temp 97.1 F (36.2 C) (Oral)   Ht '5\' 9"'  (1.753 m)   Wt 274 lb 2 oz (124.3 kg)   BMI 40.48 kg/m    Subjective:    Patient ID: Madison Berry, female    DOB: 02/08/1981, 35 y.o.   MRN: [id number]  HPI: Madison Berry is a 35 y.o. female presenting on 06/13/2016 for Establish Care (former patient of Dr. Zada Girt in [place] - had seen him for rash on face, joint pain, swelling in ankles, brusing and all over aching in her body.  he referred her to a rheumatologist, dr. Estanislado Pandy, but when she got there they wanted $350 (patient has insurance) to see her, so she left.  Patient reports she has not been satisfied with her care from Dr.  Zada Girt and wanted to make a change.)   HPI Fatigue and joint pain and facial rash Patient comes in with complaints of fatigue and joint pain and facial rash on her cheeks and nose. The fatigue has been going on for 3 or 4 months, the joint pain has been about 1-1/2 months and is an achy sensation that is diffusely in all of her joints but more specifically is in the knees and ankles. She says the joint pain she wakes up with and is stiff and has difficulty walking for about 30-40 minutes. She also has developed a rash on her face on both of her cheeks that has been present for about the past month. The rash is nonpruritic and nonpainful. The rash she is able to cover up with makeup but she is just more concerned about the whole picture and if anything is going on her underlying. She denies any fevers or chills or nosebleeds. She has been sleeping at night but still has the lack of energy.  Hypertension check Patient is also coming in as a new patient to establish care with her office for hypertension. Her blood pressures 135/80 today and she is currently on Lotensin HCT Patient denies headaches, blurred vision, chest pains, shortness of breath, or weakness. Denies any side effects from medication and is content with current medication.   Anxiety  and depression Patient is also coming to establish care for anxiety and depression. She is currently on Prozac 40 mg and has been on it and says that it is working great for her. She is sleeping well at night and she denies urinary major episodes of panic attacks or sadness. She denies any suicidal ideations. She feels very well about her life and has been since she's been on this dose of the Prozac.  GERD  Patient comes to establish care on her acid reflux as well. She is currently on omeprazole 20 mg and denies any abdominal pain or blood in her stool or heartburn and acid reflux symptoms. She has been doing well since she's been on it. She has been on it for about 3 years.  Relevant past medical, surgical, family and social history reviewed and updated as indicated. Interim medical history since our last visit reviewed. Allergies and medications reviewed and updated.  Review of Systems  Constitutional: Positive for fatigue. Negative for chills and fever.  HENT: Negative for congestion, ear discharge and ear pain.   Eyes: Negative for redness and visual disturbance.  Respiratory: Negative for chest tightness and shortness of breath.   Cardiovascular: Negative for chest pain and leg swelling.  Gastrointestinal: Negative for abdominal pain, blood in stool, constipation,  diarrhea, nausea and vomiting.  Genitourinary: Negative for difficulty urinating and dysuria.  Musculoskeletal: Positive for arthralgias and myalgias. Negative for back pain and gait problem.  Skin: Positive for rash.  Neurological: Negative for dizziness, weakness, light-headedness, numbness and headaches.  Psychiatric/Behavioral: Positive for dysphoric mood. Negative for agitation, behavioral problems, self-injury, sleep disturbance and suicidal ideas. The patient is nervous/anxious.   All other systems reviewed and are negative.   Per HPI unless specifically indicated above  Social History   Social History  . Marital  status: Married    Spouse name: N/A  . Number of children: N/A  . Years of education: N/A   Occupational History  . Not on file.   Social History Main Topics  . Smoking status: Former Smoker    Packs/day: 1.00    Years: 8.00    Types: Cigarettes    Quit date: 05/28/2000  . Smokeless tobacco: Never Used  . Alcohol use No  . Drug use: No  . Sexual activity: Yes    Birth control/ protection: Surgical     Comment: married 2003   Other Topics Concern  . Not on file   Social History Narrative  . No narrative on file    Past Surgical History:  Procedure Laterality Date  . ABDOMINAL HYSTERECTOMY     Vaginal with bladder tac  . cyst removal breast Right   . CYST REMOVAL LEG Right    foot  . MASTECTOMY, PARTIAL Right 2002  . MASTECTOMY, PARTIAL Right 06/02/2014   Procedure: MASTECTOMY PARTIAL;  Surgeon: Scherry Ran, MD;  Location: [facility];  Service: General;  Laterality: Right;  . TUBAL LIGATION  2009  . UMBILICAL HERNIA REPAIR  2009    Family History  Problem Relation Age of Onset  . Diabetes Mother   . Diabetes Father   . Cancer Father     colorectal 35  . Hypertension Father   . Hypertension Sister   . Stroke Maternal Grandmother   . Hypertension Maternal Grandmother   . Early death Paternal Grandmother   . Kidney disease Paternal Grandmother   . Hypertension Paternal Grandmother   . Diabetes Paternal Grandmother   . Cancer Paternal Grandfather       Medication List       Accurate as of 06/13/16  9:58 AM. Always use your most recent med list.          benazepril-hydrochlorthiazide 20-12.5 MG tablet Commonly known as:  LOTENSIN HCT Take 1 tablet by mouth daily.   FLUoxetine 40 MG capsule Commonly known as:  PROZAC Take 1 capsule (40 mg total) by mouth daily.   omeprazole 20 MG capsule Commonly known as:  PRILOSEC Take 20 mg by mouth daily.          Objective:    BP 135/80   Pulse 88   Temp 97.1 F (36.2 C) (Oral)   Ht '5\' 9"'   (1.753 m)   Wt 274 lb 2 oz (124.3 kg)   BMI 40.48 kg/m   Wt Readings from Last 3 Encounters:  06/13/16 274 lb 2 oz (124.3 kg)  06/02/14 260 lb (117.9 kg)  05/28/14 260 lb (117.9 kg)    Physical Exam  Constitutional: She is oriented to person, place, and time. She appears well-developed and well-nourished. No distress.  Eyes: Conjunctivae are normal.  Neck: Neck supple. No thyromegaly present.  Cardiovascular: Normal rate, regular rhythm, normal heart sounds and intact distal pulses.   No murmur heard. Pulmonary/Chest: Effort normal and  breath sounds normal. No respiratory distress. She has no wheezes. She has no rales.  Abdominal: Soft. Bowel sounds are normal. She exhibits no distension. There is no tenderness. There is no rebound.  Musculoskeletal: Normal range of motion. She exhibits no edema or tenderness (Nonreproducible joint pain on exam.).  Lymphadenopathy:    She has no cervical adenopathy.  Neurological: She is alert and oriented to person, place, and time. No cranial nerve deficit. She exhibits normal muscle tone. Coordination normal.  Skin: Skin is warm and dry. Rash (Pink macular rash on both cheeks following zygomatic process and some on her nose. Not raised or tender or warm. No drainage) noted. She is not diaphoretic.  Psychiatric: She has a normal mood and affect. Her behavior is normal.  Nursing note and vitals reviewed.     Assessment & Plan:   Problem List Items Addressed This Visit      Cardiovascular and Mediastinum   Essential hypertension, benign   Relevant Orders   CMP14+EGFR     Digestive   GERD (gastroesophageal reflux disease)     Other   Anxiety and depression   Relevant Medications   FLUoxetine (PROZAC) 40 MG capsule    Other Visit Diagnoses    Other fatigue    -  Primary   Relevant Orders   Arthritis Panel   ANA Comprehensive Panel   CMP14+EGFR   CBC with Differential/Platelet   Lipid panel   Thyroid Panel With TSH   VITAMIN D 25  Hydroxy (Vit-D Deficiency, Fractures)   Lyme Ab/Western Blot Reflex   Rocky mtn spotted fvr abs pnl(IgG+IgM)   Multiple joint pain       Relevant Orders   Arthritis Panel   ANA Comprehensive Panel   CMP14+EGFR   CBC with Differential/Platelet   Lipid panel   Thyroid Panel With TSH   VITAMIN D 25 Hydroxy (Vit-D Deficiency, Fractures)   Lyme Ab/Western Blot Reflex   Rocky mtn spotted fvr abs pnl(IgG+IgM)   Rash of face       Relevant Orders   Arthritis Panel   ANA Comprehensive Panel   CMP14+EGFR   CBC with Differential/Platelet   Lipid panel   Thyroid Panel With TSH   VITAMIN D 25 Hydroxy (Vit-D Deficiency, Fractures)   Lyme Ab/Western Blot Reflex   Rocky mtn spotted fvr abs pnl(IgG+IgM)   Lipid screening       Relevant Orders   Lipid panel       Follow up plan: Return in about 4 weeks (around 07/11/2016), or if symptoms worsen or fail to improve, for anxiety and depression and htn.  Caryl Pina, MD [facility] 06/13/2016, 9:58 AM

## 2016-06-14 LAB — LYME AB/WESTERN BLOT REFLEX: Lyme IgG/IgM Ab: <0.91 {ISR} (ref 0.00–0.90)

## 2016-06-14 LAB — ANA COMPREHENSIVE PANEL
Anti JO-1: <0.2 AI (ref 0.0–0.9)
Chromatin Ab SerPl-aCnc: <0.2 AI (ref 0.0–0.9)
Scleroderma SCL-70: <0.2 AI (ref 0.0–0.9)
dsDNA Ab: <1 IU/mL (ref 0–9)

## 2016-06-14 LAB — CMP14+EGFR
A/G RATIO: 1.6 (ref 1.2–2.2)
ALT: 38 IU/L — AB (ref 0–32)
AST: 29 IU/L (ref 0–40)
Albumin: 4.6 g/dL (ref 3.5–5.5)
Alkaline Phosphatase: 57 IU/L (ref 39–117)
BUN/Creatinine Ratio: 17 (ref 9–23)
BUN: 10 mg/dL (ref 6–20)
Bilirubin Total: 0.5 mg/dL (ref 0.0–1.2)
CALCIUM: 9.7 mg/dL (ref 8.7–10.2)
CO2: 26 mmol/L (ref 18–29)
Chloride: 98 mmol/L (ref 96–106)
Creatinine, Ser: 0.6 mg/dL (ref 0.57–1.00)
GFR, EST AFRICAN AMERICAN: 135 mL/min/{1.73_m2} (ref >59)
GFR, EST NON AFRICAN AMERICAN: 117 mL/min/{1.73_m2} (ref >59)
GLUCOSE: 99 mg/dL (ref 65–99)
Globulin, Total: 2.9 g/dL (ref 1.5–4.5)
Potassium: 4.5 mmol/L (ref 3.5–5.2)
Sodium: 139 mmol/L (ref 134–144)
Total Protein: 7.5 g/dL (ref 6.0–8.5)

## 2016-06-14 LAB — THYROID PANEL WITH TSH
Free Thyroxine Index: 1.4 (ref 1.2–4.9)
T3 Uptake Ratio: 22 % — ABNORMAL LOW (ref 24–39)
T4, Total: 6.5 ug/dL (ref 4.5–12.0)
TSH: 1.51 u[IU]/mL (ref 0.450–4.500)

## 2016-06-14 LAB — LIPID PANEL
CHOL/HDL RATIO: 4 ratio (ref 0.0–4.4)
Cholesterol, Total: 175 mg/dL (ref 100–199)
HDL: 44 mg/dL (ref >39)
LDL CALC: 101 mg/dL — AB (ref 0–99)
Triglycerides: 149 mg/dL (ref 0–149)
VLDL CHOLESTEROL CAL: 30 mg/dL (ref 5–40)

## 2016-06-14 LAB — ARTHRITIS PANEL
BASOS: 0 %
Basophils Absolute: 0 10*3/uL (ref 0.0–0.2)
EOS (ABSOLUTE): 0.1 10*3/uL (ref 0.0–0.4)
EOS: 1 %
HEMOGLOBIN: 13.6 g/dL (ref 11.1–15.9)
Hematocrit: 39.5 % (ref 34.0–46.6)
IMMATURE GRANULOCYTES: 0 %
Immature Grans (Abs): 0 10*3/uL (ref 0.0–0.1)
LYMPHS: 16 %
Lymphocytes Absolute: 1.9 10*3/uL (ref 0.7–3.1)
MCH: 27.8 pg (ref 26.6–33.0)
MCHC: 34.4 g/dL (ref 31.5–35.7)
MCV: 81 fL (ref 79–97)
MONOCYTES: 6 %
Monocytes Absolute: 0.7 10*3/uL (ref 0.1–0.9)
NEUTROS ABS: 9 10*3/uL — AB (ref 1.4–7.0)
NEUTROS PCT: 77 %
Platelets: 284 10*3/uL (ref 150–379)
RBC: 4.89 x10E6/uL (ref 3.77–5.28)
RDW: 13.1 % (ref 12.3–15.4)
Rhuematoid fact SerPl-aCnc: <10 IU/mL (ref 0.0–13.9)
SED RATE: 2 mm/h (ref 0–32)
Uric Acid: 5.5 mg/dL (ref 2.5–7.1)
WBC: 11.7 10*3/uL — ABNORMAL HIGH (ref 3.4–10.8)

## 2016-06-14 LAB — VITAMIN D 25 HYDROXY (VIT D DEFICIENCY, FRACTURES): Vit D, 25-Hydroxy: 18.2 ng/mL — ABNORMAL LOW (ref 30.0–100.0)

## 2016-06-14 LAB — ROCKY MTN SPOTTED FVR ABS PNL(IGG+IGM)
RMSF IGG: NEGATIVE
RMSF IGM: 0.37 {index} (ref 0.00–0.89)

## 2016-06-20 ENCOUNTER — Encounter: Payer: Self-pay | Admitting: Family Medicine

## 2016-06-20 MED ORDER — FLUCONAZOLE 150 MG PO TABS: 150.0000 mg | ORAL_TABLET | Freq: Once | ORAL | 0 refills | Status: AC

## 2016-06-20 MED ORDER — AZITHROMYCIN 250 MG PO TABS: ORAL_TABLET | ORAL | 0 refills | Status: DC

## 2016-07-12 ENCOUNTER — Ambulatory Visit: Payer: Self-pay | Admitting: Family Medicine

## 2016-07-17 ENCOUNTER — Encounter: Payer: Self-pay | Admitting: Family Medicine

## 2016-07-19 ENCOUNTER — Ambulatory Visit: Payer: Self-pay | Admitting: Family Medicine

## 2016-07-20 ENCOUNTER — Ambulatory Visit: Payer: Self-pay | Admitting: Family Medicine

## 2016-07-24 ENCOUNTER — Encounter: Payer: Self-pay | Admitting: Family Medicine

## 2016-07-24 ENCOUNTER — Ambulatory Visit (INDEPENDENT_AMBULATORY_CARE_PROVIDER_SITE_OTHER): Payer: No Typology Code available for payment source | Admitting: Family Medicine

## 2016-07-24 ENCOUNTER — Ambulatory Visit (INDEPENDENT_AMBULATORY_CARE_PROVIDER_SITE_OTHER): Payer: No Typology Code available for payment source

## 2016-07-24 VITALS — BP 127/78 | HR 81 | Temp 97.0°F | Ht 69.0 in | Wt 272.5 lb

## 2016-07-24 DIAGNOSIS — F418 Other specified anxiety disorders: Secondary | ICD-10-CM | POA: Diagnosis not present

## 2016-07-24 DIAGNOSIS — M160 Bilateral primary osteoarthritis of hip: Secondary | ICD-10-CM

## 2016-07-24 DIAGNOSIS — G8929 Other chronic pain: Secondary | ICD-10-CM | POA: Diagnosis not present

## 2016-07-24 DIAGNOSIS — F329 Major depressive disorder, single episode, unspecified: Secondary | ICD-10-CM

## 2016-07-24 DIAGNOSIS — F419 Anxiety disorder, unspecified: Principal | ICD-10-CM

## 2016-07-24 DIAGNOSIS — M25571 Pain in right ankle and joints of right foot: Secondary | ICD-10-CM

## 2016-07-24 MED ORDER — MELOXICAM 15 MG PO TABS: 15.0000 mg | ORAL_TABLET | Freq: Every day | ORAL | 2 refills | Status: DC

## 2016-07-24 MED ORDER — FLUOXETINE HCL 40 MG PO CAPS: 40.0000 mg | ORAL_CAPSULE | Freq: Every day | ORAL | 2 refills | Status: AC

## 2016-07-24 NOTE — Progress Notes (Signed)
BP 127/78   Pulse 81   Temp 97 F (36.1 C) (Oral)   Ht 5\' 9"  (1.753 m)   Wt 272 lb 8 oz (123.6 kg)   BMI 40.24 kg/m    Subjective:    Patient ID: Madison Berry, female    DOB: March 23, 1981, 36 y.o.   MRN: [id number]  HPI: Madison Berry is a 36 y.o. female presenting on 07/24/2016 for Anxiety and depression (followup) and Pain in legs and hips   HPI Anxiety and depression follow-up Patient is coming in today for an anxiety and depression follow-up. She feels like she is doing great on the Prozac and is not having any issues and denies any thoughts of suicide or thoughts of hurting herself. She is really feeling well and happy and her husband agrees that she is filling well. She is sleeping better at night from the anxiety standpoint but her husband is a snorer so that also causes some issues with sleep at night but other than that she is feeling really good on the Prozac.  Hip pain and ankle pain Patient is having bilateral hip pain and right ankle pain. During her last visit when she was here we did a full laboratory workup for rheumatic diseases and illnesses and it BACK to normal, this likely concludes that her hip and ankle pain are related to osteoarthritis. She has not had any x-rays or imaging of those joints. The pain in her hips his anterior in the groin line and her ankle pain is lateral just distal to the lateral malleolus. She says that they are much worse when she's stiff after having stopped 6 down for long periods of time but also get a lot worse when she is increasingly on her feet and active for prolonged periods of time. Currently she is taking BC powders and ibuprofen and Advil and Aleve at high doses for this which do help some.  Relevant past medical, surgical, family and social history reviewed and updated as indicated. Interim medical history since our last visit reviewed. Allergies and medications reviewed and updated.  Review of Systems  Constitutional: Negative for  chills and fever.  HENT: Negative for congestion, ear discharge and ear pain.   Eyes: Negative for redness and visual disturbance.  Respiratory: Negative for chest tightness and shortness of breath.   Cardiovascular: Negative for chest pain and leg swelling.  Genitourinary: Negative for difficulty urinating and dysuria.  Musculoskeletal: Positive for arthralgias. Negative for back pain and gait problem.  Skin: Negative for rash.  Neurological: Negative for light-headedness and headaches.  Psychiatric/Behavioral: Positive for sleep disturbance. Negative for agitation, behavioral problems, decreased concentration, dysphoric mood, self-injury and suicidal ideas. The patient is not nervous/anxious.   All other systems reviewed and are negative.   Per HPI unless specifically indicated above     Objective:    BP 127/78   Pulse 81   Temp 97 F (36.1 C) (Oral)   Ht 5\' 9"  (1.753 m)   Wt 272 lb 8 oz (123.6 kg)   BMI 40.24 kg/m   Wt Readings from Last 3 Encounters:  07/24/16 272 lb 8 oz (123.6 kg)  06/13/16 274 lb 2 oz (124.3 kg)  06/02/14 260 lb (117.9 kg)    Physical Exam  Constitutional: She is oriented to person, place, and time. She appears well-developed and well-nourished. No distress.  Eyes: Conjunctivae are normal.  Cardiovascular: Normal rate, regular rhythm, normal heart sounds and intact distal pulses.   No murmur  heard. Pulmonary/Chest: Effort normal and breath sounds normal. No respiratory distress. She has no wheezes. She has no rales.  Musculoskeletal: Normal range of motion. She exhibits no edema or tenderness.  Bilateral hip pain in anterior groin, exacerbated by external and internal rotation of the hip. No radiation or numbness or weakness or loss of range of motion.  Patient also has right ankle pain that is worse with full plantar flexion of the foot and the pain is just distal to the right lateral malleolus.  Neurological: She is alert and oriented to person,  place, and time. Coordination normal.  Skin: Skin is warm and dry. No rash noted. She is not diaphoretic.  Psychiatric: She has a normal mood and affect. Her behavior is normal. Thought content normal.  Nursing note and vitals reviewed.     Assessment & Plan:   Problem List Items Addressed This Visit      Other   Anxiety and depression - Primary    Doing very well on Prozac, continue medication.      Relevant Medications   FLUoxetine (PROZAC) 40 MG capsule    Other Visit Diagnoses    Primary osteoarthritis of both hips       Relevant Medications   meloxicam (MOBIC) 15 MG tablet   Other Relevant Orders   DG HIPS BILAT WITH PELVIS 3-4 VIEWS   Chronic pain of right ankle       Relevant Medications   meloxicam (MOBIC) 15 MG tablet   Other Relevant Orders   DG Ankle Complete Right       Follow up plan: Return if symptoms worsen or fail to improve.  Counseling provided for all of the vaccine components Orders Placed This Encounter  Procedures  . DG HIP UNILAT W OR W/O PELVIS 2-3 VIEWS LEFT  . DG HIP UNILAT W OR W/O PELVIS 2-3 VIEWS RIGHT  . DG Ankle Complete Right    Caryl Pina, MD [facility] 07/24/2016, 11:01 AM

## 2016-07-24 NOTE — Assessment & Plan Note (Signed)
Doing very well on Prozac, continue medication.

## 2016-08-07 ENCOUNTER — Encounter: Payer: Self-pay | Admitting: Family Medicine

## 2016-08-07 ENCOUNTER — Other Ambulatory Visit: Payer: Self-pay | Admitting: Family

## 2016-08-07 MED ORDER — OSELTAMIVIR PHOSPHATE 75 MG PO CAPS: 75.0000 mg | ORAL_CAPSULE | Freq: Two times a day (BID) | ORAL | 0 refills | Status: DC

## 2016-08-28 ENCOUNTER — Encounter: Payer: Self-pay | Admitting: Family Medicine

## 2016-08-29 MED ORDER — BENAZEPRIL-HYDROCHLOROTHIAZIDE 20-12.5 MG PO TABS: 1.0000 | ORAL_TABLET | Freq: Every day | ORAL | 1 refills | Status: DC

## 2017-01-04 ENCOUNTER — Other Ambulatory Visit: Payer: Self-pay | Admitting: Family Medicine

## 2017-01-04 DIAGNOSIS — M160 Bilateral primary osteoarthritis of hip: Secondary | ICD-10-CM

## 2017-01-04 DIAGNOSIS — M25571 Pain in right ankle and joints of right foot: Secondary | ICD-10-CM

## 2017-01-04 DIAGNOSIS — G8929 Other chronic pain: Secondary | ICD-10-CM

## 2017-04-04 ENCOUNTER — Ambulatory Visit (INDEPENDENT_AMBULATORY_CARE_PROVIDER_SITE_OTHER): Payer: No Typology Code available for payment source | Admitting: Family Medicine

## 2017-04-04 ENCOUNTER — Encounter: Payer: Self-pay | Admitting: Family Medicine

## 2017-04-04 VITALS — BP 127/84 | HR 75 | Temp 97.0°F | Ht 69.0 in | Wt 282.0 lb

## 2017-04-04 DIAGNOSIS — B301 Conjunctivitis due to adenovirus: Secondary | ICD-10-CM

## 2017-04-04 NOTE — Progress Notes (Signed)
BP 127/84   Pulse 75   Temp (!) 97 F (36.1 C) (Oral)   Ht 5\' 9"  (1.753 m)   Wt 282 lb (127.9 kg)   BMI 41.64 kg/m    Subjective:    Patient ID: Madison Berry, female    DOB: 07/26/1980, 36 y.o.   MRN: [id number]  HPI: Madison Berry is a 36 y.o. female presenting on 04/04/2017 for moderate right eye, redness, irritation and itching that began this morning. When she woke up this morning she reports her eye was crusted over and this was relieved with warm compress. She has no history of allergies and denies any sick contacts. She has not used anything over-the-counter for this just yet.  HPI Relevant past medical, surgical, family and social history reviewed and updated as indicated. Interim medical history since our last visit reviewed. Allergies and medications reviewed and updated.  Review of Systems  Constitutional: Negative for appetite change, chills, fatigue and fever.  HENT: Negative for congestion, ear discharge, ear pain, facial swelling, rhinorrhea, sinus pain, sinus pressure, sneezing and sore throat.   Eyes: Positive for pain, redness and itching. Negative for photophobia, discharge and visual disturbance.  Respiratory: Negative for cough, shortness of breath and wheezing.   Cardiovascular: Negative for chest pain, palpitations and leg swelling.  Gastrointestinal: Negative for nausea and vomiting.  Genitourinary: Negative for dysuria and hematuria.  Musculoskeletal: Negative for arthralgias, back pain, myalgias and neck stiffness.  Skin: Negative for color change, rash and wound.  Neurological: Negative for dizziness, weakness and headaches.  Psychiatric/Behavioral: Negative for agitation, behavioral problems and confusion.    Per HPI unless specifically indicated above      Objective:    BP 127/84   Pulse 75   Temp (!) 97 F (36.1 C) (Oral)   Ht 5\' 9"  (1.753 m)   Wt 282 lb (127.9 kg)   BMI 41.64 kg/m   Wt Readings from Last 3 Encounters:  04/04/17 282 lb  (127.9 kg)  07/24/16 272 lb 8 oz (123.6 kg)  06/13/16 274 lb 2 oz (124.3 kg)    Physical Exam  Constitutional: She is oriented to person, place, and time. She appears well-developed and well-nourished. No distress.  HENT:  Head: Normocephalic and atraumatic.  Right Ear: External ear normal.  Left Ear: External ear normal.  Mouth/Throat: No oropharyngeal exudate.  Eyes: Pupils are equal, round, and reactive to light. EOM are normal. Right eye exhibits no discharge. Left eye exhibits no discharge. No scleral icterus.  Conjunctiva injected bilaterally R>L  Neck: Normal range of motion. Neck supple.  Cardiovascular: Normal rate, regular rhythm and normal heart sounds.   No murmur heard. Pulmonary/Chest: Effort normal. No respiratory distress. She has no wheezes. She has no rales. She exhibits no tenderness.  Abdominal: Soft. Bowel sounds are normal. She exhibits no distension.  Musculoskeletal: Normal range of motion. She exhibits no deformity.  Neurological: She is alert and oriented to person, place, and time. She has normal reflexes. No cranial nerve deficit.  Skin: Skin is warm and dry. She is not diaphoretic. No erythema.  Psychiatric: She has a normal mood and affect. Her behavior is normal. Judgment and thought content normal.  Nursing note and vitals reviewed.     Assessment & Plan:   Problem List Items Addressed This Visit    None    Visit Diagnoses    Conjunctivitis due to adenovirus, right eye    -  Primary      Madison Berry is a 36 y.o. female presenting on 04/04/2017 for moderate right eye, redness, irritation and itching that began this morning. On exam she has injected conjunctiva R>L. She has no history of allergies, and this is likely viral conjunctivitis. I will recommend replacement tears and patanol eye drops. If this does not improve in 3-5 days or becomes acutely worse I instructed her to follow up with [place].  Follow up plan: Return if symptoms do not improve in  3-5 days.    Patient was seen and examined with Shon Hale medical student. Agree with assessment and plan above Caryl Pina, MD [facility] 04/04/2017, 9:53 AM

## 2017-04-09 ENCOUNTER — Other Ambulatory Visit: Payer: Self-pay | Admitting: Family Medicine

## 2017-04-09 DIAGNOSIS — G8929 Other chronic pain: Secondary | ICD-10-CM

## 2017-04-09 DIAGNOSIS — M25571 Pain in right ankle and joints of right foot: Secondary | ICD-10-CM

## 2017-04-09 DIAGNOSIS — M160 Bilateral primary osteoarthritis of hip: Secondary | ICD-10-CM

## 2017-05-02 ENCOUNTER — Encounter: Payer: Self-pay | Admitting: Family Medicine

## 2017-06-11 ENCOUNTER — Other Ambulatory Visit: Payer: Self-pay | Admitting: Family Medicine

## 2017-06-11 DIAGNOSIS — M160 Bilateral primary osteoarthritis of hip: Secondary | ICD-10-CM

## 2017-06-11 DIAGNOSIS — M25571 Pain in right ankle and joints of right foot: Secondary | ICD-10-CM

## 2017-06-11 DIAGNOSIS — G8929 Other chronic pain: Secondary | ICD-10-CM

## 2017-07-22 ENCOUNTER — Other Ambulatory Visit: Payer: Self-pay | Admitting: Family Medicine

## 2017-09-18 ENCOUNTER — Other Ambulatory Visit: Payer: Self-pay | Admitting: Family Medicine

## 2017-09-18 DIAGNOSIS — M25571 Pain in right ankle and joints of right foot: Secondary | ICD-10-CM

## 2017-09-18 DIAGNOSIS — M160 Bilateral primary osteoarthritis of hip: Secondary | ICD-10-CM

## 2017-09-18 DIAGNOSIS — G8929 Other chronic pain: Secondary | ICD-10-CM

## 2017-09-18 NOTE — Telephone Encounter (Signed)
Last seen 04/04/17  Dr D

## 2017-10-15 ENCOUNTER — Other Ambulatory Visit: Payer: Self-pay | Admitting: Family Medicine

## 2017-10-15 DIAGNOSIS — F419 Anxiety disorder, unspecified: Principal | ICD-10-CM

## 2017-10-15 DIAGNOSIS — F32A Depression, unspecified: Secondary | ICD-10-CM

## 2017-10-15 DIAGNOSIS — F329 Major depressive disorder, single episode, unspecified: Secondary | ICD-10-CM

## 2017-10-16 NOTE — Telephone Encounter (Signed)
Last seen 9/18

## 2017-10-28 ENCOUNTER — Other Ambulatory Visit: Payer: Self-pay | Admitting: Family Medicine

## 2017-10-28 DIAGNOSIS — M25571 Pain in right ankle and joints of right foot: Secondary | ICD-10-CM

## 2017-10-28 DIAGNOSIS — M160 Bilateral primary osteoarthritis of hip: Secondary | ICD-10-CM

## 2017-10-28 DIAGNOSIS — G8929 Other chronic pain: Secondary | ICD-10-CM

## 2017-10-28 NOTE — Telephone Encounter (Signed)
Last seen 04/04/17   Dr Dettinger 

## 2017-12-03 ENCOUNTER — Other Ambulatory Visit: Payer: Self-pay | Admitting: Family Medicine

## 2017-12-03 DIAGNOSIS — M160 Bilateral primary osteoarthritis of hip: Secondary | ICD-10-CM

## 2017-12-03 DIAGNOSIS — M25571 Pain in right ankle and joints of right foot: Secondary | ICD-10-CM

## 2017-12-03 DIAGNOSIS — G8929 Other chronic pain: Secondary | ICD-10-CM

## 2017-12-03 NOTE — Telephone Encounter (Signed)
Last seen 04/04/17   Dr Dettinger 

## 2018-01-17 ENCOUNTER — Other Ambulatory Visit: Payer: Self-pay | Admitting: Family Medicine

## 2018-01-17 DIAGNOSIS — F329 Major depressive disorder, single episode, unspecified: Secondary | ICD-10-CM

## 2018-01-17 DIAGNOSIS — F419 Anxiety disorder, unspecified: Principal | ICD-10-CM

## 2018-02-04 ENCOUNTER — Other Ambulatory Visit: Payer: Self-pay | Admitting: Family Medicine

## 2018-02-04 DIAGNOSIS — F329 Major depressive disorder, single episode, unspecified: Secondary | ICD-10-CM

## 2018-02-04 DIAGNOSIS — F32A Depression, unspecified: Secondary | ICD-10-CM

## 2018-02-04 DIAGNOSIS — F419 Anxiety disorder, unspecified: Principal | ICD-10-CM

## 2018-02-05 NOTE — Telephone Encounter (Signed)
Last seen 04/04/17   Dr Dettinger

## 2018-02-09 ENCOUNTER — Other Ambulatory Visit: Payer: Self-pay | Admitting: Family Medicine

## 2018-02-09 DIAGNOSIS — F419 Anxiety disorder, unspecified: Principal | ICD-10-CM

## 2018-02-09 DIAGNOSIS — G8929 Other chronic pain: Secondary | ICD-10-CM

## 2018-02-09 DIAGNOSIS — F329 Major depressive disorder, single episode, unspecified: Secondary | ICD-10-CM

## 2018-02-09 DIAGNOSIS — F32A Depression, unspecified: Secondary | ICD-10-CM

## 2018-02-09 DIAGNOSIS — M160 Bilateral primary osteoarthritis of hip: Secondary | ICD-10-CM

## 2018-02-09 DIAGNOSIS — M25571 Pain in right ankle and joints of right foot: Secondary | ICD-10-CM

## 2018-02-10 MED ORDER — FLUOXETINE HCL 40 MG PO CAPS: 40.0000 mg | ORAL_CAPSULE | Freq: Every day | ORAL | 0 refills | Status: DC

## 2018-02-10 MED ORDER — BENAZEPRIL-HYDROCHLOROTHIAZIDE 20-12.5 MG PO TABS: 1.0000 | ORAL_TABLET | Freq: Every day | ORAL | 0 refills | Status: DC

## 2018-02-10 MED ORDER — MELOXICAM 15 MG PO TABS: 15.0000 mg | ORAL_TABLET | Freq: Every day | ORAL | 0 refills | Status: DC

## 2018-02-10 NOTE — Telephone Encounter (Signed)
Pt has appt scheduled 02/24/18 with Dr Dettinger, ok to refill?

## 2018-02-10 NOTE — Telephone Encounter (Signed)
Dr Dettinger   Patient needs an appt for refill  Last seen 04/05/17

## 2018-02-10 NOTE — Telephone Encounter (Signed)
Pt scheduled appt with Dr Warrick Parisian 02/24/18 at 10:55. She is leaving to go on vacation tomorrow and would like to know if you could send in a 30 day supply of her Prozac so she doesn't have to stop taking it.

## 2018-02-11 NOTE — Telephone Encounter (Signed)
Multiple encounters open, will close this one. See other encounter.

## 2018-02-24 ENCOUNTER — Ambulatory Visit: Payer: No Typology Code available for payment source | Admitting: Family Medicine

## 2018-02-27 ENCOUNTER — Encounter: Payer: Self-pay | Admitting: Family Medicine

## 2019-02-09 ENCOUNTER — Other Ambulatory Visit: Payer: Self-pay

## 2019-02-09 ENCOUNTER — Other Ambulatory Visit: Payer: No Typology Code available for payment source

## 2019-02-09 DIAGNOSIS — Z1383 Encounter for screening for respiratory disorder NEC: Secondary | ICD-10-CM

## 2019-02-09 DIAGNOSIS — Z20822 Contact with and (suspected) exposure to covid-19: Secondary | ICD-10-CM

## 2019-02-10 LAB — NOVEL CORONAVIRUS, NAA: SARS-CoV-2, NAA: NOT DETECTED

## 2019-05-05 ENCOUNTER — Other Ambulatory Visit: Payer: Self-pay | Admitting: *Deleted

## 2019-05-05 DIAGNOSIS — Z20822 Contact with and (suspected) exposure to covid-19: Secondary | ICD-10-CM

## 2019-05-07 LAB — NOVEL CORONAVIRUS, NAA: SARS-CoV-2, NAA: NOT DETECTED

## 2021-12-18 ENCOUNTER — Encounter: Payer: Self-pay | Admitting: Internal Medicine

## 2022-01-10 ENCOUNTER — Encounter: Payer: Self-pay | Admitting: Internal Medicine

## 2022-01-10 ENCOUNTER — Ambulatory Visit: Payer: No Typology Code available for payment source | Admitting: Internal Medicine

## 2022-03-01 NOTE — Progress Notes (Unsigned)
GI Office Note    Referring Provider: Dr. Scarlette Ar Primary Care Physician:  Neale Burly, MD  Primary Gastroenterologist: Elon Alas. Abbey Chatters, DO   Chief Complaint   Chief Complaint  Patient presents with   Rectal Bleeding    Has not seen any the last three days. Thinks it may be from hemorrhoids but father had colon cx.      History of Present Illness   MOSELLE RISTER is a 41 y.o. female presenting today for blood in the stool.    Intermittent rectal bleeding off/on but recently large volume. More constipation and incomplete stools after last year. Sometimes hurts to have bm. Has to use potty stool. Veggie laxatives. Waits until hasn't been and hurts.   No recent medication changes.   Dyspohagia with solid foods. Has to burp a lot.   No liquidd or pill sdysphagi.   Heartburn well control. More than 10 years. Remote egd. With her tcs. Mother has esophagus stretch.        Remote egd/tcs. Had colonic diverticulosis per patient.     Medications   Current Outpatient Medications  Medication Sig Dispense Refill   benazepril-hydrochlorthiazide (LOTENSIN HCT) 20-12.5 MG tablet Take 1 tablet by mouth daily. 90 tablet 0   busPIRone (BUSPAR) 15 MG tablet Take 15 mg by mouth 2 (two) times daily.     FLUoxetine (PROZAC) 40 MG capsule Take 1 capsule (40 mg total) by mouth daily. 90 capsule 2   montelukast (SINGULAIR) 10 MG tablet Take 10 mg by mouth daily.     nitroGLYCERIN (NITROSTAT) 0.4 MG SL tablet Place under the tongue.     omeprazole (PRILOSEC) 20 MG capsule Take 20 mg by mouth daily.     No current facility-administered medications for this visit.    Allergies   Allergies as of 03/02/2022 - Review Complete 03/02/2022  Allergen Reaction Noted   Imitrex [sumatriptan] Hives, Shortness Of Breath, and Swelling 05/28/2014   Sulfa antibiotics Hives and Swelling 05/28/2014    Past Medical History   Past Medical History:  Diagnosis Date   Anxiety     Depression    GERD (gastroesophageal reflux disease)    Headache    Hypertension    OCD (obsessive compulsive disorder)     Past Surgical History   Past Surgical History:  Procedure Laterality Date   ABDOMINAL HYSTERECTOMY     Vaginal with bladder tac   cyst removal breast Right    CYST REMOVAL LEG Right    foot   MASTECTOMY, PARTIAL Right 2002   MASTECTOMY, PARTIAL Right 06/02/2014   Procedure: MASTECTOMY PARTIAL;  Surgeon: Scherry Ran, MD;  Location: [facility];  Service: General;  Laterality: Right;   TUBAL LIGATION  4492   UMBILICAL HERNIA REPAIR  2009    Past Family History   Family History  Problem Relation Age of Onset   Diabetes Mother    Diabetes Father    Colon cancer Father 13   Hypertension Father    Bladder Cancer Father    Hypertension Sister    Stroke Maternal Grandmother    Hypertension Maternal Grandmother    Early death Paternal Grandmother    Kidney disease Paternal Grandmother    Hypertension Paternal Grandmother    Diabetes Paternal Grandmother    Lung cancer Paternal Grandfather     Past Social History   Social History   Socioeconomic History   Marital status: Married    Spouse name: Not on file  Number of children: Not on file   Years of education: Not on file   Highest education level: Not on file  Occupational History   Not on file  Tobacco Use   Smoking status: Former    Packs/day: 1.00    Years: 8.00    Total pack years: 8.00    Types: Cigarettes    Quit date: 05/28/2000    Years since quitting: 21.7   Smokeless tobacco: Never  Vaping Use   Vaping Use: Never used  Substance and Sexual Activity   Alcohol use: No   Drug use: No   Sexual activity: Yes    Birth control/protection: Surgical    Comment: married 2003  Other Topics Concern   Not on file  Social History Narrative   Not on file   Social Determinants of Health   Financial Resource Strain: Not on file  Food Insecurity: Not on file  Transportation  Needs: Not on file  Physical Activity: Not on file  Stress: Not on file  Social Connections: Not on file  Intimate Partner Violence: Not on file    Review of Systems   General: Negative for anorexia, weight loss, fever, chills, fatigue, weakness. Eyes: Negative for vision changes.  ENT: Negative for hoarseness,  nasal congestion. CV: Negative for chest pain, angina, palpitations, dyspnea on exertion, peripheral edema.  Respiratory: Negative for dyspnea at rest, dyspnea on exertion, cough, sputum, wheezing.  GI: See history of present illness. GU:  Negative for dysuria, hematuria, urinary incontinence, urinary frequency, nocturnal urination.  MS: Negative for joint pain, low back pain.  Derm: Negative for rash or itching.  Neuro: Negative for weakness, abnormal sensation, seizure, frequent headaches, memory loss,  confusion.  Psych: Negative for  suicidal ideation, hallucinations. See hpi Endo: Negative for unusual weight change.  Heme: Negative for bruising or bleeding. Allergy: Negative for rash or hives.  Physical Exam   BP 123/75 (BP Location: Right Arm, Patient Position: Sitting, Cuff Size: Large)   Pulse 85   Temp (!) 97.1 F (36.2 C) (Temporal)   Ht 5' 8.5" (1.74 m)   Wt 290 lb 6.4 oz (131.7 kg)   SpO2 98%   BMI 43.51 kg/m    General: Well-nourished, well-developed in no acute distress.  Head: Normocephalic, atraumatic.   Eyes: Conjunctiva pink, no icterus. Mouth: Oropharyngeal mucosa moist and pink , no lesions erythema or exudate. Neck: Supple without thyromegaly, masses, or lymphadenopathy.  Lungs: Clear to auscultation bilaterally.  Heart: Regular rate and rhythm, no murmurs rubs or gallops.  Abdomen: Bowel sounds are normal, nontender, nondistended, no hepatosplenomegaly or masses,  no abdominal bruits or hernia, no rebound or guarding.   Rectal: not performed Extremities: No lower extremity edema. No clubbing or deformities.  Neuro: Alert and oriented x 4 ,  grossly normal neurologically.  Skin: Warm and dry, no rash or jaundice.   Psych: Alert and cooperative, normal mood and affect.  Labs   None available  Imaging Studies   No results found.  Assessment   Chronic GERD:  Esophageal dysphagia:  Constipation/change in bowels:  Rectal bleeding:  FH CRC: father at age 109.    PLAN   Colonoscopy/EGD/ED with Dr. Abbey Chatters. ASA 3.  I have discussed the risks, alternatives, benefits with regards to but not limited to the risk of reaction to medication, bleeding, infection, perforation and the patient is agreeable to proceed. Written consent to be obtained.    Laureen Ochs. Bobby Rumpf, [place], [facility]

## 2022-03-02 ENCOUNTER — Ambulatory Visit (INDEPENDENT_AMBULATORY_CARE_PROVIDER_SITE_OTHER): Payer: No Typology Code available for payment source | Admitting: Gastroenterology

## 2022-03-02 ENCOUNTER — Encounter: Payer: Self-pay | Admitting: Gastroenterology

## 2022-03-02 VITALS — BP 123/75 | HR 85 | Temp 97.1°F | Ht 68.5 in | Wt 290.4 lb

## 2022-03-02 DIAGNOSIS — K5900 Constipation, unspecified: Secondary | ICD-10-CM

## 2022-03-02 DIAGNOSIS — K219 Gastro-esophageal reflux disease without esophagitis: Secondary | ICD-10-CM | POA: Diagnosis not present

## 2022-03-02 DIAGNOSIS — R1319 Other dysphagia: Secondary | ICD-10-CM

## 2022-03-02 DIAGNOSIS — Z800 Family history of malignant neoplasm of digestive organs: Secondary | ICD-10-CM

## 2022-03-02 DIAGNOSIS — K625 Hemorrhage of anus and rectum: Secondary | ICD-10-CM

## 2022-03-02 NOTE — Patient Instructions (Addendum)
Start miralax one capful twice daily until good soft bowel movement. Then continue one capful once daily.  If miralax does not get your stools moving well, please call me. It is important to get stools moving regular before your colonoscopy. Continue omeprazole '20mg'$  daily before breakfast.  Upper endoscopy with stretching of your esophagus and colonoscopy to be scheduled. See separate instructions.

## 2022-03-05 ENCOUNTER — Encounter: Payer: Self-pay | Admitting: *Deleted

## 2022-03-05 MED ORDER — PEG 3350-KCL-NA BICARB-NACL 420 G PO SOLR: 4000.0000 mL | Freq: Once | ORAL | 0 refills | Status: AC

## 2022-03-06 ENCOUNTER — Telehealth: Payer: Self-pay | Admitting: *Deleted

## 2022-03-06 NOTE — Telephone Encounter (Signed)
[employer] and spoke with Jamie C. No PA is required for TCS/EGD/DIL.

## 2022-04-05 NOTE — Patient Instructions (Signed)
Madison Berry  04/05/2022     '@PREFPERIOPPHARMACY'$ @   Your procedure is scheduled on  04/13/2022.   Report to [facility] at  0700  A.M.   Call this number if you have problems the morning of surgery:  [phone]   Remember:  Follow the diet and prep instructions given to you by the office.     Take these medicines the morning of surgery with A SIP OF WATER                                            buspar, prozac, prilosec.    Do not wear jewelry, make-up or nail polish.  Do not wear lotions, powders, or perfumes, or deodorant.  Do not shave 48 hours prior to surgery.  Men may shave face and neck.  Do not bring valuables to the hospital.  [facility] is not responsible for any belongings or valuables.  Contacts, dentures or bridgework may not be worn into surgery.  Leave your suitcase in the car.  After surgery it may be brought to your room.  For patients admitted to the hospital, discharge time will be determined by your treatment team.  Patients discharged the day of surgery will not be allowed to drive home and must have someone with them for 24 hours.    Special instructions:   DO NOT smoke tobacco or vape for 24 hours before your procedure.  Please read over the following fact sheets that you were given. Anesthesia Post-op Instructions and Care and Recovery After Surgery      Upper Endoscopy, Adult, Care After After the procedure, it is common to have a sore throat. It is also common to have: Mild stomach pain or discomfort. Bloating. Nausea. Follow these instructions at home: The instructions below may help you care for yourself at home. Your health care provider may give you more instructions. If you have questions, ask your health care provider. If you were given a sedative during the procedure, it can affect you for several hours. Do not drive or operate machinery until your health care provider says that it is safe. If you will be going  home right after the procedure, plan to have a responsible adult: Take you home from the hospital or clinic. You will not be allowed to drive. Care for you for the time you are told. Follow instructions from your health care provider about what you may eat and drink. Return to your normal activities as told by your health care provider. Ask your health care provider what activities are safe for you. Take over-the-counter and prescription medicines only as told by your health care provider. Contact a health care provider if you: Have a sore throat that lasts longer than one day. Have trouble swallowing. Have a fever. Get help right away if you: Vomit blood or your vomit looks like coffee grounds. Have bloody, black, or tarry stools. Have a very bad sore throat or you cannot swallow. Have difficulty breathing or very bad pain in your chest or abdomen. These symptoms may be an emergency. Get help right away. Call 911. Do not wait to see if the symptoms will go away. Do not drive yourself to the hospital. Summary After the procedure, it is common to have a sore throat, mild stomach discomfort, bloating, and nausea.  If you were given a sedative during the procedure, it can affect you for several hours. Do not drive until your health care provider says that it is safe. Follow instructions from your health care provider about what you may eat and drink. Return to your normal activities as told by your health care provider. This information is not intended to replace advice given to you by your health care provider. Make sure you discuss any questions you have with your health care provider. Document Revised: 10/11/2021 Document Reviewed: 10/11/2021 Elsevier Patient Education  [place]. Esophageal Dilatation Esophageal dilatation, also called esophageal dilation, is a procedure to widen or open a blocked or narrowed part of the esophagus. The esophagus is the part of the body that moves  food and liquid from the mouth to the stomach. You may need this procedure if: You have a buildup of scar tissue in your esophagus that makes it difficult, painful, or impossible to swallow. This can be caused by gastroesophageal reflux disease (GERD). You have cancer of the esophagus. There is a problem with how food moves through your esophagus. In some cases, you may need this procedure repeated at a later time to dilate the esophagus gradually. Tell a health care provider about: Any allergies you have. All medicines you are taking, including vitamins, herbs, eye drops, creams, and over-the-counter medicines. Any problems you or family members have had with anesthetic medicines. Any blood disorders you have. Any surgeries you have had. Any medical conditions you have. Any antibiotic medicines you are required to take before dental procedures. Whether you are pregnant or may be pregnant. What are the risks? Generally, this is a safe procedure. However, problems may occur, including: Bleeding due to a tear in the lining of the esophagus. A hole, or perforation, in the esophagus. What happens before the procedure? Ask your health care provider about: Changing or stopping your regular medicines. This is especially important if you are taking diabetes medicines or blood thinners. Taking medicines such as aspirin and ibuprofen. These medicines can thin your blood. Do not take these medicines unless your health care provider tells you to take them. Taking over-the-counter medicines, vitamins, herbs, and supplements. Follow instructions from your health care provider about eating or drinking restrictions. Plan to have a responsible adult take you home from the hospital or clinic. Plan to have a responsible adult care for you for the time you are told after you leave the hospital or clinic. This is important. What happens during the procedure? You may be given a medicine to help you relax  (sedative). A numbing medicine may be sprayed into the back of your throat, or you may gargle the medicine. Your health care provider may perform the dilatation using various surgical instruments, such as: Simple dilators. This instrument is carefully placed in the esophagus to stretch it. Guided wire bougies. This involves using an endoscope to insert a wire into the esophagus. A dilator is passed over this wire to enlarge the esophagus. Then the wire is removed. Balloon dilators. An endoscope with a small balloon is inserted into the esophagus. The balloon is inflated to stretch the esophagus and open it up. The procedure may vary among health care providers and hospitals. What can I expect after the procedure? Your blood pressure, heart rate, breathing rate, and blood oxygen level will be monitored until you leave the hospital or clinic. Your throat may feel slightly sore and numb. This will get better over time. You will not  be allowed to eat or drink until your throat is no longer numb. When you are able to drink, urinate, and sit on the edge of the bed without nausea or dizziness, you may be able to return home. Follow these instructions at home: Take over-the-counter and prescription medicines only as told by your health care provider. If you were given a sedative during the procedure, it can affect you for several hours. Do not drive or operate machinery until your health care provider says that it is safe. Plan to have a responsible adult care for you for the time you are told. This is important. Follow instructions from your health care provider about any eating or drinking restrictions. Do not use any products that contain nicotine or tobacco, such as cigarettes, e-cigarettes, and chewing tobacco. If you need help quitting, ask your health care provider. Keep all follow-up visits. This is important. Contact a health care provider if: You have a fever. You have pain that is not  relieved by medicine. Get help right away if: You have chest pain. You have trouble breathing. You have trouble swallowing. You vomit blood. You have black, tarry, or bloody stools. These symptoms may represent a serious problem that is an emergency. Do not wait to see if the symptoms will go away. Get medical help right away. Call your local emergency services (911 in the [place] Do not drive yourself to the hospital. Summary Esophageal dilatation, also called esophageal dilation, is a procedure to widen or open a blocked or narrowed part of the esophagus. Plan to have a responsible adult take you home from the hospital or clinic. For this procedure, a numbing medicine may be sprayed into the back of your throat, or you may gargle the medicine. Do not drive or operate machinery until your health care provider says that it is safe. This information is not intended to replace advice given to you by your health care provider. Make sure you discuss any questions you have with your health care provider. Document Revised: 11/18/2019 Document Reviewed: 11/18/2019 Elsevier Patient Education  [place]. Colonoscopy, Adult, Care After The following information offers guidance on how to care for yourself after your procedure. Your health care provider may also give you more specific instructions. If you have problems or questions, contact your health care provider. What can I expect after the procedure? After the procedure, it is common to have: A small amount of blood in your stool for 24 hours after the procedure. Some gas. Mild cramping or bloating of your abdomen. Follow these instructions at home: Eating and drinking  Drink enough fluid to keep your urine pale yellow. Follow instructions from your health care provider about eating or drinking restrictions. Resume your normal diet as told by your health care provider. Avoid heavy or fried foods that are hard to digest. Activity Rest  as told by your health care provider. Avoid sitting for a long time without moving. Get up to take short walks every 1-2 hours. This is important to improve blood flow and breathing. Ask for help if you feel weak or unsteady. Return to your normal activities as told by your health care provider. Ask your health care provider what activities are safe for you. Managing cramping and bloating  Try walking around when you have cramps or feel bloated. If directed, apply heat to your abdomen as told by your health care provider. Use the heat source that your health care provider recommends, such as a moist heat  pack or a heating pad. Place a towel between your skin and the heat source. Leave the heat on for 20-30 minutes. Remove the heat if your skin turns bright red. This is especially important if you are unable to feel pain, heat, or cold. You have a greater risk of getting burned. General instructions If you were given a sedative during the procedure, it can affect you for several hours. Do not drive or operate machinery until your health care provider says that it is safe. For the first 24 hours after the procedure: Do not sign important documents. Do not drink alcohol. Do your regular daily activities at a slower pace than normal. Eat soft foods that are easy to digest. Take over-the-counter and prescription medicines only as told by your health care provider. Keep all follow-up visits. This is important. Contact a health care provider if: You have blood in your stool 2-3 days after the procedure. Get help right away if: You have more than a small spotting of blood in your stool. You have large blood clots in your stool. You have swelling of your abdomen. You have nausea or vomiting. You have a fever. You have increasing pain in your abdomen that is not relieved with medicine. These symptoms may be an emergency. Get help right away. Call 911. Do not wait to see if the symptoms will go  away. Do not drive yourself to the hospital. Summary After the procedure, it is common to have a small amount of blood in your stool. You may also have mild cramping and bloating of your abdomen. If you were given a sedative during the procedure, it can affect you for several hours. Do not drive or operate machinery until your health care provider says that it is safe. Get help right away if you have a lot of blood in your stool, nausea or vomiting, a fever, or increased pain in your abdomen. This information is not intended to replace advice given to you by your health care provider. Make sure you discuss any questions you have with your health care provider. Document Revised: 02/22/2021 Document Reviewed: 02/22/2021 Elsevier Patient Education  [place] After This sheet gives you information about how to care for yourself after your procedure. Your health care provider may also give you more specific instructions. If you have problems or questions, contact your health care provider. What can I expect after the procedure? After the procedure, it is common to have: Tiredness. Forgetfulness about what happened after the procedure. Impaired judgment for important decisions. Nausea or vomiting. Some difficulty with balance. Follow these instructions at home: For the time period you were told by your health care provider:     Rest as needed. Do not participate in activities where you could fall or become injured. Do not drive or use machinery. Do not drink alcohol. Do not take sleeping pills or medicines that cause drowsiness. Do not make important decisions or sign legal documents. Do not take care of children on your own. Eating and drinking Follow the diet that is recommended by your health care provider. Drink enough fluid to keep your urine pale yellow. If you vomit: Drink water, juice, or soup when you can drink without vomiting. Make  sure you have little or no nausea before eating solid foods. General instructions Have a responsible adult stay with you for the time you are told. It is important to have someone help care for you until you are awake  and alert. Take over-the-counter and prescription medicines only as told by your health care provider. If you have sleep apnea, surgery and certain medicines can increase your risk for breathing problems. Follow instructions from your health care provider about wearing your sleep device: Anytime you are sleeping, including during daytime naps. While taking prescription pain medicines, sleeping medicines, or medicines that make you drowsy. Avoid smoking. Keep all follow-up visits as told by your health care provider. This is important. Contact a health care provider if: You keep feeling nauseous or you keep vomiting. You feel light-headed. You are still sleepy or having trouble with balance after 24 hours. You develop a rash. You have a fever. You have redness or swelling around the IV site. Get help right away if: You have trouble breathing. You have new-onset confusion at home. Summary For several hours after your procedure, you may feel tired. You may also be forgetful and have poor judgment. Have a responsible adult stay with you for the time you are told. It is important to have someone help care for you until you are awake and alert. Rest as told. Do not drive or operate machinery. Do not drink alcohol or take sleeping pills. Get help right away if you have trouble breathing, or if you suddenly become confused. This information is not intended to replace advice given to you by your health care provider. Make sure you discuss any questions you have with your health care provider. Document Revised: 06/06/2021 Document Reviewed: 06/04/2019 Elsevier Patient Education  [place].

## 2022-04-09 ENCOUNTER — Encounter (HOSPITAL_COMMUNITY)
Admission: RE | Admit: 2022-04-09 | Discharge: 2022-04-09 | Disposition: A | Discharge status: Home / Self Care | Payer: No Typology Code available for payment source | Source: Ambulatory Visit | Attending: Internal Medicine | Admitting: Internal Medicine

## 2022-04-09 DIAGNOSIS — Z79899 Other long term (current) drug therapy: Secondary | ICD-10-CM

## 2022-04-09 DIAGNOSIS — I10 Essential (primary) hypertension: Secondary | ICD-10-CM

## 2022-04-10 ENCOUNTER — Encounter (HOSPITAL_COMMUNITY): Payer: Self-pay

## 2022-04-10 ENCOUNTER — Encounter (HOSPITAL_COMMUNITY)
Admission: RE | Admit: 2022-04-10 | Discharge: 2022-04-10 | Disposition: A | Discharge status: Home / Self Care | Payer: No Typology Code available for payment source | Source: Ambulatory Visit | Attending: Internal Medicine | Admitting: Internal Medicine

## 2022-04-10 DIAGNOSIS — Z01818 Encounter for other preprocedural examination: Secondary | ICD-10-CM | POA: Insufficient documentation

## 2022-04-10 DIAGNOSIS — Z79899 Other long term (current) drug therapy: Secondary | ICD-10-CM | POA: Insufficient documentation

## 2022-04-10 DIAGNOSIS — I10 Essential (primary) hypertension: Secondary | ICD-10-CM | POA: Diagnosis not present

## 2022-04-10 LAB — BASIC METABOLIC PANEL
Anion gap: 13 (ref 5–15)
BUN: 11 mg/dL (ref 6–20)
CO2: 23 mmol/L (ref 22–32)
Calcium: 8.7 mg/dL — ABNORMAL LOW (ref 8.9–10.3)
Chloride: 99 mmol/L (ref 98–111)
Creatinine, Ser: 0.64 mg/dL (ref 0.44–1.00)
GFR, Estimated: >60 mL/min (ref >60)
Glucose, Bld: 182 mg/dL — ABNORMAL HIGH (ref 70–99)
Potassium: 3.1 mmol/L — ABNORMAL LOW (ref 3.5–5.1)
Sodium: 135 mmol/L (ref 135–145)

## 2022-04-13 ENCOUNTER — Other Ambulatory Visit: Payer: Self-pay

## 2022-04-13 ENCOUNTER — Ambulatory Visit (HOSPITAL_COMMUNITY): Payer: No Typology Code available for payment source | Admitting: Anesthesiology

## 2022-04-13 ENCOUNTER — Encounter (HOSPITAL_COMMUNITY)
Admission: RE | Disposition: A | Discharge status: Home / Self Care | Payer: Self-pay | Source: Home / Self Care | Attending: Internal Medicine

## 2022-04-13 ENCOUNTER — Ambulatory Visit (HOSPITAL_BASED_OUTPATIENT_CLINIC_OR_DEPARTMENT_OTHER): Payer: No Typology Code available for payment source | Admitting: Anesthesiology

## 2022-04-13 ENCOUNTER — Ambulatory Visit (HOSPITAL_COMMUNITY)
Admission: RE | Admit: 2022-04-13 | Discharge: 2022-04-13 | Disposition: A | Discharge status: Home / Self Care | Payer: No Typology Code available for payment source | Attending: Internal Medicine | Admitting: Internal Medicine

## 2022-04-13 ENCOUNTER — Encounter (HOSPITAL_COMMUNITY): Payer: Self-pay

## 2022-04-13 DIAGNOSIS — R1310 Dysphagia, unspecified: Secondary | ICD-10-CM | POA: Diagnosis not present

## 2022-04-13 DIAGNOSIS — K648 Other hemorrhoids: Secondary | ICD-10-CM

## 2022-04-13 DIAGNOSIS — Z87891 Personal history of nicotine dependence: Secondary | ICD-10-CM | POA: Insufficient documentation

## 2022-04-13 DIAGNOSIS — K219 Gastro-esophageal reflux disease without esophagitis: Secondary | ICD-10-CM | POA: Diagnosis not present

## 2022-04-13 DIAGNOSIS — I10 Essential (primary) hypertension: Secondary | ICD-10-CM | POA: Insufficient documentation

## 2022-04-13 DIAGNOSIS — K317 Polyp of stomach and duodenum: Secondary | ICD-10-CM

## 2022-04-13 DIAGNOSIS — K5731 Diverticulosis of large intestine without perforation or abscess with bleeding: Secondary | ICD-10-CM

## 2022-04-13 DIAGNOSIS — K2970 Gastritis, unspecified, without bleeding: Secondary | ICD-10-CM

## 2022-04-13 DIAGNOSIS — K921 Melena: Secondary | ICD-10-CM | POA: Diagnosis present

## 2022-04-13 DIAGNOSIS — F418 Other specified anxiety disorders: Secondary | ICD-10-CM | POA: Insufficient documentation

## 2022-04-13 DIAGNOSIS — K5730 Diverticulosis of large intestine without perforation or abscess without bleeding: Secondary | ICD-10-CM | POA: Diagnosis not present

## 2022-04-13 DIAGNOSIS — Z6841 Body Mass Index (BMI) 40.0 and over, adult: Secondary | ICD-10-CM | POA: Diagnosis not present

## 2022-04-13 HISTORY — PX: ESOPHAGOGASTRODUODENOSCOPY (EGD) WITH PROPOFOL: SHX5813

## 2022-04-13 HISTORY — PX: BALLOON DILATION: SHX5330

## 2022-04-13 HISTORY — PX: COLONOSCOPY WITH PROPOFOL: SHX5780

## 2022-04-13 HISTORY — PX: BIOPSY: SHX5522

## 2022-04-13 SURGERY — COLONOSCOPY WITH PROPOFOL
Anesthesia: General

## 2022-04-13 MED ORDER — LIDOCAINE HCL (PF) 2 % IJ SOLN
INTRAMUSCULAR | Status: AC
  Filled 2022-04-13: qty 40

## 2022-04-13 MED ORDER — LIDOCAINE HCL (CARDIAC) PF 100 MG/5ML IV SOSY
PREFILLED_SYRINGE | INTRAVENOUS | Status: DC | PRN
  Administered 2022-04-13: 50 mg via INTRAVENOUS

## 2022-04-13 MED ORDER — PROPOFOL 1000 MG/100ML IV EMUL
INTRAVENOUS | Status: AC
  Filled 2022-04-13: qty 300

## 2022-04-13 MED ORDER — PROPOFOL 500 MG/50ML IV EMUL
INTRAVENOUS | Status: DC | PRN
  Administered 2022-04-13: 150 ug/kg/min via INTRAVENOUS

## 2022-04-13 MED ORDER — PROPOFOL 10 MG/ML IV BOLUS
INTRAVENOUS | Status: DC | PRN
  Administered 2022-04-13: 50 mg via INTRAVENOUS
  Administered 2022-04-13: 100 mg via INTRAVENOUS
  Administered 2022-04-13 (×2): 50 mg via INTRAVENOUS

## 2022-04-13 MED ORDER — LACTATED RINGERS IV SOLN: INTRAVENOUS | Status: DC | PRN

## 2022-04-13 MED ORDER — PROPOFOL 500 MG/50ML IV EMUL
INTRAVENOUS | Status: AC
  Filled 2022-04-13: qty 150

## 2022-04-13 NOTE — Transfer of Care (Signed)
Immediate Anesthesia Transfer of Care Note  Patient: Madison Berry  Procedure(s) Performed: COLONOSCOPY WITH PROPOFOL ESOPHAGOGASTRODUODENOSCOPY (EGD) WITH PROPOFOL BALLOON DILATION BIOPSY  Patient Location: Short Stay  Anesthesia Type:General  Level of Consciousness: sedated  Airway & Oxygen Therapy: Patient Spontanous Breathing and Patient connected to nasal cannula oxygen  Post-op Assessment: Report given to RN and Post -op Vital signs reviewed and stable  Post vital signs: Reviewed and stable  Last Vitals:  Vitals Value Taken Time  BP 109/80   Temp 36   Pulse 86   Resp 16   SpO2 96     Last Pain:  Vitals:   04/13/22 0837  TempSrc:   PainSc: 0-No pain      Patients Stated Pain Goal: 7 (31/42/76 7011)  Complications: No notable events documented.

## 2022-04-13 NOTE — Anesthesia Postprocedure Evaluation (Signed)
Anesthesia Post Note  Patient: Bennetta L Lumb  Procedure(s) Performed: COLONOSCOPY WITH PROPOFOL ESOPHAGOGASTRODUODENOSCOPY (EGD) WITH PROPOFOL BALLOON DILATION BIOPSY  Patient location during evaluation: Phase II Anesthesia Type: General Level of consciousness: awake Pain management: pain level controlled Vital Signs Assessment: post-procedure vital signs reviewed and stable Respiratory status: spontaneous breathing and respiratory function stable Cardiovascular status: blood pressure returned to baseline and stable Postop Assessment: no headache and no apparent nausea or vomiting Anesthetic complications: no Comments: Late entry   No notable events documented.   Last Vitals:  Vitals:   04/13/22 0759 04/13/22 0912  BP: (!) 148/90 127/75  Pulse: 71 87  Resp: 16 17  Temp: 37.2 C 36.4 C  SpO2: 99% 95%    Last Pain:  Vitals:   04/13/22 0928  TempSrc:   PainSc: 0-No pain                 Louann Sjogren

## 2022-04-13 NOTE — H&P (Signed)
Primary Care Physician:  Neale Burly, MD Primary Gastroenterologist:  Dr. Abbey Chatters  Pre-Procedure History & Physical: HPI:  Madison Berry is a 41 y.o. female is here for an EGD with possible dilation due to solid food dysphagia and colonoscopy for rectal bleeding.  Past Medical History:  Diagnosis Date   Anxiety    Depression    GERD (gastroesophageal reflux disease)    Headache    Hypertension    OCD (obsessive compulsive disorder)     Past Surgical History:  Procedure Laterality Date   ABDOMINAL HYSTERECTOMY     Vaginal with bladder tac   cyst removal breast Right    CYST REMOVAL LEG Right    foot   MASTECTOMY, PARTIAL Right 2002   MASTECTOMY, PARTIAL Right 06/02/2014   Procedure: MASTECTOMY PARTIAL;  Surgeon: Scherry Ran, MD;  Location: [facility];  Service: General;  Laterality: Right;   TUBAL LIGATION  3235   UMBILICAL HERNIA REPAIR  2009    Prior to Admission medications   Medication Sig Start Date End Date Taking? Authorizing Provider  benazepril-hydrochlorthiazide (LOTENSIN HCT) 20-25 MG tablet Take 1 tablet by mouth daily. 02/18/22  Yes [provider]  busPIRone (BUSPAR) 15 MG tablet Take 15 mg by mouth 2 (two) times daily. 02/08/22  Yes [provider]  carboxymethylcellulose (REFRESH PLUS) 0.5 % SOLN Place 1 drop into both eyes 3 (three) times daily as needed (dry eyes).   Yes [provider]  FLUoxetine (PROZAC) 40 MG capsule Take 1 capsule (40 mg total) by mouth daily. 07/24/16  Yes Dettinger, Fransisca Kaufmann, MD  montelukast (SINGULAIR) 10 MG tablet Take 10 mg by mouth daily. 10/25/21  Yes [provider]  nitroGLYCERIN (NITROSTAT) 0.4 MG SL tablet Place 0.4 mg under the tongue every 5 (five) minutes as needed for chest pain. 12/05/21  Yes [provider]  Omega-3 Fatty Acids (FISH OIL) 1000 MG CAPS Take 1,000 mg by mouth in the morning and at bedtime.   Yes [provider]  omeprazole (PRILOSEC) 20 MG capsule Take  20 mg by mouth daily.   Yes [provider]    Allergies as of 03/05/2022 - Review Complete 03/02/2022  Allergen Reaction Noted   Imitrex [sumatriptan] Hives, Shortness Of Breath, and Swelling 05/28/2014   Sulfa antibiotics Hives and Swelling 05/28/2014    Family History  Problem Relation Age of Onset   Diabetes Mother    Diabetes Father    Colon cancer Father 20   Hypertension Father    Bladder Cancer Father    Hypertension Sister    Stroke Maternal Grandmother    Hypertension Maternal Grandmother    Early death Paternal Grandmother    Kidney disease Paternal Grandmother    Hypertension Paternal Grandmother    Diabetes Paternal Grandmother    Lung cancer Paternal Grandfather     Social History   Socioeconomic History   Marital status: Married    Spouse name: Not on file   Number of children: Not on file   Years of education: Not on file   Highest education level: Not on file  Occupational History   Not on file  Tobacco Use   Smoking status: Former    Packs/day: 1.00    Years: 8.00    Total pack years: 8.00    Types: Cigarettes    Quit date: 05/28/2000    Years since quitting: 21.8   Smokeless tobacco: Never  Vaping Use   Vaping Use: Never used  Substance and Sexual Activity   Alcohol use: No   Drug use: No   Sexual activity: Yes    Birth control/protection: Surgical    Comment: married 2003  Other Topics Concern   Not on file  Social History Narrative   Not on file   Social Determinants of Health   Financial Resource Strain: Not on file  Food Insecurity: Not on file  Transportation Needs: Not on file  Physical Activity: Not on file  Stress: Not on file  Social Connections: Not on file  Intimate Partner Violence: Not on file    Review of Systems: See HPI, otherwise negative ROS  Physical Exam: Vital signs in last 24 hours: Temp:  [98.9 F (37.2 C)] 98.9 F (37.2 C) (09/29 0759) Pulse Rate:  [71] 71 (09/29 0759) Resp:  [16] 16  (09/29 0759) BP: (148)/(90) 148/90 (09/29 0759) SpO2:  [99 %] 99 % (09/29 0759)   General:   Alert,  Well-developed, well-nourished, pleasant and cooperative in NAD Head:  Normocephalic and atraumatic. Eyes:  Sclera clear, no icterus.   Conjunctiva pink. Ears:  Normal auditory acuity. Nose:  No deformity, discharge,  or lesions. Mouth:  No deformity or lesions, dentition normal. Neck:  Supple; no masses or thyromegaly. Lungs:  Clear throughout to auscultation.   No wheezes, crackles, or rhonchi. No acute distress. Heart:  Regular rate and rhythm; no murmurs, clicks, rubs,  or gallops. Abdomen:  Soft, nontender and nondistended. No masses, hepatosplenomegaly or hernias noted. Normal bowel sounds, without guarding, and without rebound.   Msk:  Symmetrical without gross deformities. Normal posture. Extremities:  Without clubbing or edema. Neurologic:  Alert and  oriented x4;  grossly normal neurologically. Skin:  Intact without significant lesions or rashes. Cervical Nodes:  No significant cervical adenopathy. Psych:  Alert and cooperative. Normal mood and affect.  Impression/Plan: Prosper is here for an EGD with possible dilation due to solid food dysphagia and colonoscopy for rectal bleeding.  The risks of the procedure including infection, bleed, or perforation as well as benefits, limitations, alternatives and imponderables have been reviewed with the patient. Questions have been answered. All parties agreeable.

## 2022-04-13 NOTE — Op Note (Signed)
[facility] Patient Name: Madison Berry Procedure Date: 04/13/2022 8:33 AM MRN: [id number] Date of Birth: 02/20/1981 Attending MD: Elon Alas. Abbey Chatters DO CSN: [id number] Age: 41 Admit Type: Outpatient Procedure:                Upper GI endoscopy Indications:              Dysphagia Providers:                Elon Alas. Abbey Chatters, DO, Crystal Page, Raphael Gibney                            Tech., Technician Referring MD:              Medicines:                See the Anesthesia note for documentation of the                            administered medications Complications:            No immediate complications. Estimated Blood Loss:     Estimated blood loss was minimal. Procedure:                Pre-Anesthesia Assessment:                           - The anesthesia plan was to use monitored                            anesthesia care (MAC).                           After obtaining informed consent, the endoscope was                            passed under direct vision. Throughout the                            procedure, the patient's blood pressure, pulse, and                            oxygen saturations were monitored continuously. The                            [id number]-H190 ([id number]) scope was introduced through the                            mouth, and advanced to the second part of duodenum.                            The upper GI endoscopy was accomplished without                            difficulty. The patient tolerated the procedure                            well. Scope In: 8:42:59 AM Scope Out:  8:49:10 AM Total Procedure Duration: 0 hours 6 minutes 11 seconds  Findings:      The Z-line was regular.      No endoscopic abnormality was evident in the esophagus to explain the       patient's complaint of dysphagia. Preparations were made for empiric       dilation. A TTS dilator was passed through the scope. Dilation with an       18-19-20 mm balloon dilator was performed to 20  mm. Dilation was       performed with a mild resistance at 20 mm DUE TO POSSIBLE PROXIMAL       ESOPHAGEAL WEB. Estimated blood loss was none.      Multiple 4 to 8 mm fundic gland polyps with no bleeding and no stigmata       of recent bleeding were found in the gastric body.      Patchy mild inflammation characterized by erythema was found in the       gastric body and in the gastric antrum. Biopsies were taken with a cold       forceps for Helicobacter pylori testing.      The duodenal bulb, first portion of the duodenum and second portion of       the duodenum were normal. Impression:               - Z-line regular.                           - Multiple gastric polyps.                           - Gastritis. Biopsied.                           - Normal duodenal bulb, first portion of the                            duodenum and second portion of the duodenum. Moderate Sedation:      Per Anesthesia Care Recommendation:           - Patient has a contact number available for                            emergencies. The signs and symptoms of potential                            delayed complications were discussed with the                            patient. Return to normal activities tomorrow.                            Written discharge instructions were provided to the                            patient.                           - Resume previous diet.                           -  Continue present medications.                           - Await pathology results.                           - Repeat upper endoscopy PRN for retreatment.                           - Return to GI clinic in 3 months. Procedure Code(s):        --- Professional ---                           [phone], Esophagogastroduodenoscopy, flexible,                            transoral; with biopsy, single or multiple Diagnosis Code(s):        --- Professional ---                           K31.7, Polyp of stomach and  duodenum                           K29.70, Gastritis, unspecified, without bleeding                           R13.10, Dysphagia, unspecified CPT copyright 2019 American Medical Association. All rights reserved. The codes documented in this report are preliminary and upon coder review may  be revised to meet current compliance requirements. Elon Alas. Abbey Chatters, DO [name], DO 04/13/2022 8:52:24 AM This report has been signed electronically. Number of Addenda: 0

## 2022-04-13 NOTE — Op Note (Signed)
[facility] Patient Name: Madison Berry Procedure Date: 04/13/2022 8:32 AM MRN: [id number] Date of Birth: [birth year] Attending MD: Elon Alas. Abbey Chatters DO CSN: [id number] Age: 41 Admit Type: Outpatient Procedure:                Colonoscopy Indications:              Hematochezia Providers:                Elon Alas. Abbey Chatters, DO, Crystal Page, Raphael Gibney                            Tech., Technician Referring MD:              Medicines:                See the Anesthesia note for documentation of the                            administered medications Complications:            No immediate complications. Estimated Blood Loss:     Estimated blood loss: none. Procedure:                Pre-Anesthesia Assessment:                           - The anesthesia plan was to use monitored                            anesthesia care (MAC).                           After obtaining informed consent, the colonoscope                            was passed under direct vision. Throughout the                            procedure, the patient's blood pressure, pulse, and                            oxygen saturations were monitored continuously. The                            PCF-HQ190L ([id number]) scope was introduced through                            the anus and advanced to the the cecum, identified                            by appendiceal orifice and ileocecal valve. The                            colonoscopy was performed without difficulty. The                            patient tolerated the procedure well. The quality  of the bowel preparation was evaluated using the                            BBPS [facility] Bowel Preparation Scale) with scores                            of: Right Colon = 2 (minor amount of residual                            staining, small fragments of stool and/or opaque                            liquid, but mucosa seen well), Transverse Colon = 2                             (minor amount of residual staining, small fragments                            of stool and/or opaque liquid, but mucosa seen                            well) and Left Colon = 2 (minor amount of residual                            staining, small fragments of stool and/or opaque                            liquid, but mucosa seen well). The total BBPS score                            equals 6. The quality of the bowel preparation was                            fair. Scope In: 8:52:57 AM Scope Out: 9:07:46 AM Scope Withdrawal Time: 0 hours 10 minutes 30 seconds  Total Procedure Duration: 0 hours 14 minutes 49 seconds  Findings:      The perianal and digital rectal examinations were normal.      Non-bleeding internal hemorrhoids were found during retroflexion.      Many small and large-mouthed diverticula were found in the entire colon.      A moderate amount of liquid stool was found in the entire colon, making       visualization difficult. Lavage of the area was performed using copious       amounts of sterile water, resulting in clearance with fair visualization. Impression:               - Preparation of the colon was fair.                           - Non-bleeding internal hemorrhoids.                           - Diverticulosis in the entire examined colon.                           -  Stool in the entire examined colon.                           - No specimens collected. Moderate Sedation:      Per Anesthesia Care Recommendation:           - Patient has a contact number available for                            emergencies. The signs and symptoms of potential                            delayed complications were discussed with the                            patient. Return to normal activities tomorrow.                            Written discharge instructions were provided to the                            patient.                           - Resume previous  diet.                           - Continue present medications.                           - Repeat colonoscopy in 5 years for screening                            purposes/marginal prep today                           - Return to GI clinic in 3 months.                           - Cosnider hemorrhoid banding if bleeding continues Procedure Code(s):        --- Professional ---                           [phone], Colonoscopy, flexible; diagnostic, including                            collection of specimen(s) by brushing or washing,                            when performed (separate procedure) Diagnosis Code(s):        --- Professional ---                           D32.6, Other hemorrhoids                           K92.1, Melena (includes Hematochezia)  K57.30, Diverticulosis of large intestine without                            perforation or abscess without bleeding CPT copyright 2019 American Medical Association. All rights reserved. The codes documented in this report are preliminary and upon coder review may  be revised to meet current compliance requirements. Elon Alas. Abbey Chatters, DO [place] Abbey Chatters, DO 04/13/2022 9:11:42 AM This report has been signed electronically. Number of Addenda: 0

## 2022-04-13 NOTE — Anesthesia Preprocedure Evaluation (Signed)
Anesthesia Evaluation  Patient identified by MRN, date of birth, ID band Patient awake    Reviewed: Allergy & Precautions, H&P , NPO status , Patient's Chart, lab work & pertinent test results, reviewed documented beta blocker date and time   Airway Mallampati: II  TM Distance: >3 FB Neck ROM: full    Dental no notable dental hx.    Pulmonary neg pulmonary ROS, former smoker,    Pulmonary exam normal breath sounds clear to auscultation       Cardiovascular Exercise Tolerance: Good hypertension, negative cardio ROS   Rhythm:regular Rate:Normal     Neuro/Psych  Headaches, PSYCHIATRIC DISORDERS Anxiety Depression    GI/Hepatic Neg liver ROS, GERD  Medicated,  Endo/Other  Morbid obesity  Renal/GU negative Renal ROS  negative genitourinary   Musculoskeletal   Abdominal   Peds  Hematology negative hematology ROS (+)   Anesthesia Other Findings   Reproductive/Obstetrics negative OB ROS                             Anesthesia Physical Anesthesia Plan  ASA: 3  Anesthesia Plan: General   Post-op Pain Management:    Induction:   PONV Risk Score and Plan: Propofol infusion  Airway Management Planned:   Additional Equipment:   Intra-op Plan:   Post-operative Plan:   Informed Consent: I have reviewed the patients History and Physical, chart, labs and discussed the procedure including the risks, benefits and alternatives for the proposed anesthesia with the patient or authorized representative who has indicated his/her understanding and acceptance.     Dental Advisory Given  Plan Discussed with: CRNA  Anesthesia Plan Comments:         Anesthesia Quick Evaluation

## 2022-04-13 NOTE — Discharge Instructions (Addendum)
EGD Discharge instructions Please read the instructions outlined below and refer to this sheet in the next few weeks. These discharge instructions provide you with general information on caring for yourself after you leave the hospital. Your doctor may also give you specific instructions. While your treatment has been planned according to the most current medical practices available, unavoidable complications occasionally occur. If you have any problems or questions after discharge, please call your doctor. ACTIVITY You may resume your regular activity but move at a slower pace for the next 24 hours.  Take frequent rest periods for the next 24 hours.  Walking will help expel (get rid of) the air and reduce the bloated feeling in your abdomen.  No driving for 24 hours (because of the anesthesia (medicine) used during the test).  You may shower.  Do not sign any important legal documents or operate any machinery for 24 hours (because of the anesthesia used during the test).  NUTRITION Drink plenty of fluids.  You may resume your normal diet.  Begin with a light meal and progress to your normal diet.  Avoid alcoholic beverages for 24 hours or as instructed by your caregiver.  MEDICATIONS You may resume your normal medications unless your caregiver tells you otherwise.  WHAT YOU CAN EXPECT TODAY You may experience abdominal discomfort such as a feeling of fullness or "gas" pains.  FOLLOW-UP Your doctor will discuss the results of your test with you.  SEEK IMMEDIATE MEDICAL ATTENTION IF ANY OF THE FOLLOWING OCCUR: Excessive nausea (feeling sick to your stomach) and/or vomiting.  Severe abdominal pain and distention (swelling).  Trouble swallowing.  Temperature over 101 F (37.8 C).  Rectal bleeding or vomiting of blood.    Colonoscopy Discharge Instructions  Read the instructions outlined below and refer to this sheet in the next few weeks. These discharge instructions provide you with  general information on caring for yourself after you leave the hospital. Your doctor may also give you specific instructions. While your treatment has been planned according to the most current medical practices available, unavoidable complications occasionally occur.   ACTIVITY You may resume your regular activity, but move at a slower pace for the next 24 hours.  Take frequent rest periods for the next 24 hours.  Walking will help get rid of the air and reduce the bloated feeling in your belly (abdomen).  No driving for 24 hours (because of the medicine (anesthesia) used during the test).   Do not sign any important legal documents or operate any machinery for 24 hours (because of the anesthesia used during the test).  NUTRITION Drink plenty of fluids.  You may resume your normal diet as instructed by your doctor.  Begin with a light meal and progress to your normal diet. Heavy or fried foods are harder to digest and may make you feel sick to your stomach (nauseated).  Avoid alcoholic beverages for 24 hours or as instructed.  MEDICATIONS You may resume your normal medications unless your doctor tells you otherwise.  WHAT YOU CAN EXPECT TODAY Some feelings of bloating in the abdomen.  Passage of more gas than usual.  Spotting of blood in your stool or on the toilet paper.  IF YOU HAD POLYPS REMOVED DURING THE COLONOSCOPY: No aspirin products for 7 days or as instructed.  No alcohol for 7 days or as instructed.  Eat a soft diet for the next 24 hours.  FINDING OUT THE RESULTS OF YOUR TEST Not all test results are available  during your visit. If your test results are not back during the visit, make an appointment with your caregiver to find out the results. Do not assume everything is normal if you have not heard from your caregiver or the medical facility. It is important for you to follow up on all of your test results.  SEEK IMMEDIATE MEDICAL ATTENTION IF: You have more than a spotting of  blood in your stool.  Your belly is swollen (abdominal distention).  You are nauseated or vomiting.  You have a temperature over 101.  You have abdominal pain or discomfort that is severe or gets worse throughout the day.   Your EGD revealed mild amount inflammation in your stomach.  I took biopsies of this to rule out infection with a bacteria called H. pylori.  Await pathology results, my office will contact you.  I stretched your esophagus today.  Hope this helps with your swallowing.  Continue on omeprazole daily.  Your colonoscopy was relatively unremarkable.  I did not find any polyps or evidence of colon cancer.  I recommend repeating colonoscopy in 5 years for colon cancer screening purposes as the prep today was not ideal.  You do have diverticulosis and internal hemorrhoids. I would recommend increasing fiber in your diet or adding OTC Benefiber/Metamucil. Be sure to drink at least 4 to 6 glasses of water daily.   We can consider hemorrhoid banding to treat these if bleeding continues.  Follow-up with GI in 3-4 months   I hope you have a great rest of your week!  Elon Alas. Abbey Chatters, D.O. Gastroenterology and Hepatology [facility]

## 2022-04-16 LAB — SURGICAL PATHOLOGY

## 2022-04-18 ENCOUNTER — Encounter (HOSPITAL_COMMUNITY): Payer: Self-pay | Admitting: Internal Medicine

## 2022-07-02 ENCOUNTER — Ambulatory Visit: Payer: Self-pay | Admitting: Gastroenterology
